# Patient Record
Sex: Male | Born: 1940
Health system: Southern US, Community
[De-identification: ages and names within clinical notes are randomized; demographics above are authoritative.]

## PROBLEM LIST (undated history)

## (undated) DIAGNOSIS — E785 Hyperlipidemia, unspecified: Secondary | ICD-10-CM

## (undated) DIAGNOSIS — I10 Essential (primary) hypertension: Secondary | ICD-10-CM

## (undated) DIAGNOSIS — N183 Chronic kidney disease, stage 3 unspecified: Secondary | ICD-10-CM

## (undated) DIAGNOSIS — C801 Malignant (primary) neoplasm, unspecified: Secondary | ICD-10-CM

## (undated) DIAGNOSIS — K219 Gastro-esophageal reflux disease without esophagitis: Secondary | ICD-10-CM

## (undated) HISTORY — DX: Chronic kidney disease, stage 3 unspecified: N18.30

## (undated) HISTORY — DX: Hyperlipidemia, unspecified: E78.5

## (undated) HISTORY — DX: Malignant (primary) neoplasm, unspecified: C80.1

## (undated) HISTORY — DX: Essential (primary) hypertension: I10

## (undated) HISTORY — DX: Gastro-esophageal reflux disease without esophagitis: K21.9

## (undated) HISTORY — PX: COLONOSCOPY WITH PROPOFOL: SHX5780

---

## 2012-12-26 HISTORY — PX: MELANOMA EXCISION: SHX5266

## 2014-12-22 ENCOUNTER — Ambulatory Visit: Payer: Self-pay | Admitting: Gastroenterology

## 2015-07-18 ENCOUNTER — Telehealth: Payer: Self-pay | Admitting: Family Medicine

## 2015-07-18 NOTE — Telephone Encounter (Signed)
PT NEEDS REFILLS ON  ALL MEDICATION,  BUT IS NEEDING THE AMLODIPINE BESYLATE 5MG  ESPECIALLY. PHARM IS [facility] [place].

## 2015-07-20 MED ORDER — AMLODIPINE BESYLATE 5 MG PO TABS: 5.0000 mg | ORAL_TABLET | Freq: Every day | ORAL | Status: DC

## 2015-07-20 NOTE — Telephone Encounter (Signed)
Amlodipine has been refilled and sent to [facility]

## 2015-07-25 ENCOUNTER — Encounter (INDEPENDENT_AMBULATORY_CARE_PROVIDER_SITE_OTHER): Payer: Self-pay

## 2015-07-25 ENCOUNTER — Ambulatory Visit (INDEPENDENT_AMBULATORY_CARE_PROVIDER_SITE_OTHER): Payer: Medicare Other | Admitting: Family Medicine

## 2015-07-25 ENCOUNTER — Encounter: Payer: Self-pay | Admitting: Family Medicine

## 2015-07-25 VITALS — BP 140/80 | HR 78 | Temp 98.2°F | Resp 18 | Ht 73.0 in | Wt 205.1 lb

## 2015-07-25 DIAGNOSIS — E785 Hyperlipidemia, unspecified: Secondary | ICD-10-CM | POA: Diagnosis not present

## 2015-07-25 DIAGNOSIS — I10 Essential (primary) hypertension: Secondary | ICD-10-CM

## 2015-07-25 DIAGNOSIS — G250 Essential tremor: Secondary | ICD-10-CM | POA: Insufficient documentation

## 2015-07-25 DIAGNOSIS — R3911 Hesitancy of micturition: Secondary | ICD-10-CM | POA: Insufficient documentation

## 2015-07-25 DIAGNOSIS — K219 Gastro-esophageal reflux disease without esophagitis: Secondary | ICD-10-CM | POA: Diagnosis not present

## 2015-07-25 DIAGNOSIS — J3089 Other allergic rhinitis: Secondary | ICD-10-CM | POA: Insufficient documentation

## 2015-07-25 MED ORDER — PRAVASTATIN SODIUM 40 MG PO TABS: 40.0000 mg | ORAL_TABLET | Freq: Every day | ORAL | Status: DC

## 2015-07-25 MED ORDER — LOSARTAN POTASSIUM-HCTZ 50-12.5 MG PO TABS: 1.0000 | ORAL_TABLET | Freq: Every day | ORAL | Status: DC

## 2015-07-25 MED ORDER — AMLODIPINE BESYLATE 5 MG PO TABS: 5.0000 mg | ORAL_TABLET | Freq: Every day | ORAL | Status: DC

## 2015-07-25 NOTE — Progress Notes (Signed)
Name: Zachary Meyer   MRN: [id number]    DOB: 1941/08/17   Date:07/25/2015       Progress Note  Subjective  Chief Complaint  Chief Complaint  Patient presents with  . Follow-up    3 mo. elevated BP  . Hypertension  . Hyperlipidemia  . Gastrophageal Reflux    Hypertension This is a chronic problem. The problem is controlled. Pertinent negatives include no chest pain, headaches, palpitations or shortness of breath. Past treatments include angiotensin blockers, diuretics and calcium channel blockers.  Hyperlipidemia This is a new problem. The problem is controlled. Pertinent negatives include no chest pain, leg pain, myalgias or shortness of breath. Current antihyperlipidemic treatment includes statins.  Gastrophageal Reflux He reports no abdominal pain, no belching, no chest pain, no dysphagia, no heartburn or no sore throat. This is a chronic problem. He has tried a PPI for the symptoms.     Past Medical History  Diagnosis Date  . GERD (gastroesophageal reflux disease)   . Hypertension   . Hyperlipidemia     Past Surgical History  Procedure Laterality Date  . Melanoma excision Left 12/2012    Family History  Problem Relation Age of Onset  . Hypertension Sister   . Hypertension Brother     Social History   Social History  . Marital Status: Married    Spouse Name: N/A  . Number of Children: N/A  . Years of Education: N/A   Occupational History  . Not on file.   Social History Main Topics  . Smoking status: Former Research scientist (life sciences)  . Smokeless tobacco: Never Used  . Alcohol Use: No  . Drug Use: No  . Sexual Activity: No   Other Topics Concern  . Not on file   Social History Narrative  . No narrative on file     Current outpatient prescriptions:  .  amLODipine (NORVASC) 5 MG tablet, Take 1 tablet (5 mg total) by mouth daily., Disp: 90 tablet, Rfl: 0 .  fluticasone (FLONASE) 50 MCG/ACT nasal spray, INSTILL 2 SPRAYS INTO EACH NOSTRIL DAILY, Disp: , Rfl: 0 .   losartan-hydrochlorothiazide (HYZAAR) 50-12.5 MG per tablet, Take 1 tablet by mouth daily., Disp: , Rfl: 0 .  omeprazole (PRILOSEC OTC) 20 MG tablet, Take 20 mg by mouth as needed., Disp: , Rfl:  .  pravastatin (PRAVACHOL) 40 MG tablet, Take 40 mg by mouth at bedtime., Disp: , Rfl: 0 .  propranolol (INDERAL) 20 MG tablet, Take 20 mg by mouth 2 (two) times daily., Disp: , Rfl: 1  No Known Allergies   Review of Systems  HENT: Negative for sore throat.   Respiratory: Negative for shortness of breath.   Cardiovascular: Negative for chest pain and palpitations.  Gastrointestinal: Negative for heartburn, dysphagia and abdominal pain.  Musculoskeletal: Negative for myalgias.  Neurological: Negative for headaches.    Objective  Filed Vitals:   07/25/15 0932  BP: 140/80  Pulse: 78  Temp: 98.2 F (36.8 C)  TempSrc: Oral  Resp: 18  Height: 6\' 1"  (1.854 m)  Weight: 205 lb 1.6 oz (93.033 kg)  SpO2: 95%    Physical Exam  Constitutional: He is oriented to person, place, and time and well-developed, well-nourished, and in no distress.  HENT:  Head: Normocephalic and atraumatic.  Cardiovascular: Normal rate and regular rhythm.   Pulmonary/Chest: Effort normal and breath sounds normal.  Abdominal: Soft. Bowel sounds are normal.  Neurological: He is alert and oriented to person, place, and time.  Nursing note and  vitals reviewed.   Assessment & Plan  1. Essential hypertension  - amLODipine (NORVASC) 5 MG tablet; Take 1 tablet (5 mg total) by mouth daily.  Dispense: 90 tablet; Refill: 0 - losartan-hydrochlorothiazide (HYZAAR) 50-12.5 MG per tablet; Take 1 tablet by mouth daily.  Dispense: 90 tablet; Refill: 0  2. Gastroesophageal reflux disease, esophagitis presence not specified Stable on Prilosec OTC.  3. Hyperlipidemia  - Lipid Profile - COMPLETE METABOLIC PANEL WITH GFR - pravastatin (PRAVACHOL) 40 MG tablet; Take 1 tablet (40 mg total) by mouth at bedtime.  Dispense: 90  tablet; Refill: 0  Lorana Maffeo Asad A[facility] 07/25/2015 9:50 AM

## 2015-07-26 LAB — COMPREHENSIVE METABOLIC PANEL
A/G RATIO: 1.8 (ref 1.1–2.5)
ALBUMIN: 4.5 g/dL (ref 3.5–4.8)
ALK PHOS: 87 IU/L (ref 39–117)
ALT: 18 IU/L (ref 0–44)
AST: 17 IU/L (ref 0–40)
BILIRUBIN TOTAL: 0.6 mg/dL (ref 0.0–1.2)
BUN / CREAT RATIO: 9 — AB (ref 10–22)
BUN: 13 mg/dL (ref 8–27)
CHLORIDE: 99 mmol/L (ref 97–108)
CO2: 27 mmol/L (ref 18–29)
Calcium: 9.9 mg/dL (ref 8.6–10.2)
Creatinine, Ser: 1.38 mg/dL — ABNORMAL HIGH (ref 0.76–1.27)
GFR calc non Af Amer: 51 mL/min/{1.73_m2} — ABNORMAL LOW (ref >59)
GFR, EST AFRICAN AMERICAN: 59 mL/min/{1.73_m2} — AB (ref >59)
GLUCOSE: 111 mg/dL — AB (ref 65–99)
Globulin, Total: 2.5 g/dL (ref 1.5–4.5)
POTASSIUM: 4.6 mmol/L (ref 3.5–5.2)
SODIUM: 141 mmol/L (ref 134–144)
TOTAL PROTEIN: 7 g/dL (ref 6.0–8.5)

## 2015-07-26 LAB — LIPID PANEL
CHOLESTEROL TOTAL: 190 mg/dL (ref 100–199)
Chol/HDL Ratio: 5.8 ratio units — ABNORMAL HIGH (ref 0.0–5.0)
HDL: 33 mg/dL — AB (ref >39)
LDL Calculated: 129 mg/dL — ABNORMAL HIGH (ref 0–99)
TRIGLYCERIDES: 142 mg/dL (ref 0–149)
VLDL CHOLESTEROL CAL: 28 mg/dL (ref 5–40)

## 2015-07-28 ENCOUNTER — Telehealth: Payer: Self-pay | Admitting: Family Medicine

## 2015-07-28 MED ORDER — ATORVASTATIN CALCIUM 20 MG PO TABS: 20.0000 mg | ORAL_TABLET | Freq: Every day | ORAL | Status: DC

## 2015-07-28 NOTE — Telephone Encounter (Signed)
Medication has been filled and [facility] to [facility] [place] [facility]

## 2015-08-26 ENCOUNTER — Other Ambulatory Visit: Payer: Self-pay | Admitting: Family Medicine

## 2015-10-16 ENCOUNTER — Other Ambulatory Visit: Payer: Self-pay | Admitting: Family Medicine

## 2015-10-25 ENCOUNTER — Ambulatory Visit (INDEPENDENT_AMBULATORY_CARE_PROVIDER_SITE_OTHER): Payer: Medicare Other | Admitting: Family Medicine

## 2015-10-25 ENCOUNTER — Encounter: Payer: Self-pay | Admitting: Family Medicine

## 2015-10-25 VITALS — BP 138/78 | HR 74 | Temp 98.1°F | Resp 17 | Ht 73.0 in | Wt 206.3 lb

## 2015-10-25 DIAGNOSIS — R748 Abnormal levels of other serum enzymes: Secondary | ICD-10-CM

## 2015-10-25 DIAGNOSIS — K219 Gastro-esophageal reflux disease without esophagitis: Secondary | ICD-10-CM | POA: Diagnosis not present

## 2015-10-25 DIAGNOSIS — E785 Hyperlipidemia, unspecified: Secondary | ICD-10-CM | POA: Diagnosis not present

## 2015-10-25 DIAGNOSIS — I10 Essential (primary) hypertension: Secondary | ICD-10-CM | POA: Diagnosis not present

## 2015-10-25 DIAGNOSIS — R7989 Other specified abnormal findings of blood chemistry: Secondary | ICD-10-CM

## 2015-10-25 MED ORDER — AMLODIPINE BESYLATE 5 MG PO TABS: 5.0000 mg | ORAL_TABLET | Freq: Every day | ORAL | Status: DC

## 2015-10-25 MED ORDER — ATORVASTATIN CALCIUM 20 MG PO TABS: 20.0000 mg | ORAL_TABLET | Freq: Every day | ORAL | Status: DC

## 2015-10-25 MED ORDER — LOSARTAN POTASSIUM-HCTZ 50-12.5 MG PO TABS: 1.0000 | ORAL_TABLET | Freq: Every day | ORAL | Status: DC

## 2015-10-25 NOTE — Progress Notes (Signed)
Name: Zachary Meyer   MRN: [id number]    DOB: Dec 19, 1940   Date:10/25/2015       Progress Note  Subjective  Chief Complaint  Chief Complaint  Patient presents with  . Follow-up    3 mo  . Hypertension  . Hyperlipidemia  . Gastroesophageal Reflux    Hypertension This is a chronic problem. The problem is controlled. Pertinent negatives include no blurred vision, chest pain, headaches or shortness of breath. Past treatments include calcium channel blockers, angiotensin blockers and diuretics. There is no history of CAD/MI or CVA.  Hyperlipidemia This is a chronic problem. The problem is controlled. Recent lipid tests were reviewed and are high. He has no history of obesity. Pertinent negatives include no chest pain, leg pain, myalgias or shortness of breath. Current antihyperlipidemic treatment includes statins. There are no compliance problems.  Risk factors for coronary artery disease include dyslipidemia, male sex and hypertension.  Gastroesophageal Reflux He reports no abdominal pain, no belching, no chest pain, no coughing, no heartburn, no nausea or no sore throat. This is a chronic problem. The problem has been unchanged. He has tried a PPI for the symptoms. The treatment provided significant relief.     Past Medical History  Diagnosis Date  . GERD (gastroesophageal reflux disease)   . Hypertension   . Hyperlipidemia     Past Surgical History  Procedure Laterality Date  . Melanoma excision Left 12/2012    Family History  Problem Relation Age of Onset  . Hypertension Sister   . Hypertension Brother     Social History   Social History  . Marital Status: Married    Spouse Name: N/A  . Number of Children: N/A  . Years of Education: N/A   Occupational History  . Not on file.   Social History Main Topics  . Smoking status: Former Research scientist (life sciences)  . Smokeless tobacco: Never Used  . Alcohol Use: No  . Drug Use: No  . Sexual Activity: No   Other Topics Concern  . Not  on file   Social History Narrative     Current outpatient prescriptions:  .  amLODipine (NORVASC) 5 MG tablet, TAKE 1 TABLET (5 MG TOTAL) BY MOUTH DAILY., Disp: 90 tablet, Rfl: 0 .  atorvastatin (LIPITOR) 20 MG tablet, TAKE 1 TABLET (20 MG TOTAL) BY MOUTH AT BEDTIME., Disp: 30 tablet, Rfl: 2 .  fluticasone (FLONASE) 50 MCG/ACT nasal spray, INSTILL 2 SPRAYS INTO EACH NOSTRIL DAILY, Disp: , Rfl: 0 .  losartan-hydrochlorothiazide (HYZAAR) 50-12.5 MG per tablet, Take 1 tablet by mouth daily., Disp: 90 tablet, Rfl: 0 .  omeprazole (PRILOSEC OTC) 20 MG tablet, Take 20 mg by mouth as needed., Disp: , Rfl:  .  propranolol (INDERAL) 20 MG tablet, Take 20 mg by mouth 2 (two) times daily., Disp: , Rfl: 1  No Known Allergies   Review of Systems  Constitutional: Negative for fever and chills.  HENT: Negative for sore throat.   Eyes: Negative for blurred vision.  Respiratory: Negative for cough and shortness of breath.   Cardiovascular: Negative for chest pain.  Gastrointestinal: Negative for heartburn, nausea, vomiting and abdominal pain.  Musculoskeletal: Negative for myalgias and joint pain.  Neurological: Negative for headaches.     Objective  Filed Vitals:   10/25/15 0805  BP: 138/78  Pulse: 74  Temp: 98.1 F (36.7 C)  TempSrc: Oral  Resp: 17  Height: 6\' 1"  (1.854 m)  Weight: 206 lb 4.8 oz (93.577 kg)  SpO2: 97%  Physical Exam  Constitutional: He is oriented to person, place, and time and well-developed, well-nourished, and in no distress.  HENT:  Head: Normocephalic and atraumatic.  Eyes: Pupils are equal, round, and reactive to light.  Cardiovascular: Normal rate, regular rhythm and normal heart sounds.   Pulmonary/Chest: Effort normal and breath sounds normal.  Abdominal: Soft. Bowel sounds are normal.  Musculoskeletal: He exhibits no edema.  Neurological: He is alert and oriented to person, place, and time.  Psychiatric: Mood, memory, affect and judgment normal.   Nursing note and vitals reviewed.    Assessment & Plan  1. Essential hypertension BP stable and controlled on present therapy. Refills provided - amLODipine (NORVASC) 5 MG tablet; Take 1 tablet (5 mg total) by mouth daily.  Dispense: 90 tablet; Refill: 0 - losartan-hydrochlorothiazide (HYZAAR) 50-12.5 MG tablet; Take 1 tablet by mouth daily.  Dispense: 90 tablet; Refill: 0  2. Gastroesophageal reflux disease, esophagitis presence not specified Takes Prilosec OTC for relief of occasional heartburn. Stable  3. Hyperlipidemia Changed from pravastatin to atorvastatin in August 2016. Recheck FLP today. - atorvastatin (LIPITOR) 20 MG tablet; Take 1 tablet (20 mg total) by mouth daily at 6 PM.  Dispense: 90 tablet; Refill: 0 - Lipid Profile  4. Elevated serum creatinine  - Comprehensive Metabolic Panel (CMET)   Dorlis Judice [facility] A[facility] 10/25/2015 8:21 AM

## 2015-10-26 LAB — COMPREHENSIVE METABOLIC PANEL
A/G RATIO: 1.9 (ref 1.1–2.5)
ALK PHOS: 94 IU/L (ref 39–117)
ALT: 19 IU/L (ref 0–44)
AST: 17 IU/L (ref 0–40)
Albumin: 4.6 g/dL (ref 3.5–4.8)
BILIRUBIN TOTAL: 0.6 mg/dL (ref 0.0–1.2)
BUN/Creatinine Ratio: 9 — ABNORMAL LOW (ref 10–22)
BUN: 11 mg/dL (ref 8–27)
CALCIUM: 9.7 mg/dL (ref 8.6–10.2)
CHLORIDE: 99 mmol/L (ref 97–106)
CO2: 27 mmol/L (ref 18–29)
Creatinine, Ser: 1.25 mg/dL (ref 0.76–1.27)
GFR calc Af Amer: 67 mL/min/{1.73_m2} (ref >59)
GFR, EST NON AFRICAN AMERICAN: 58 mL/min/{1.73_m2} — AB (ref >59)
GLOBULIN, TOTAL: 2.4 g/dL (ref 1.5–4.5)
Glucose: 107 mg/dL — ABNORMAL HIGH (ref 65–99)
POTASSIUM: 4.5 mmol/L (ref 3.5–5.2)
SODIUM: 139 mmol/L (ref 136–144)
Total Protein: 7 g/dL (ref 6.0–8.5)

## 2015-10-26 LAB — LIPID PANEL
CHOL/HDL RATIO: 4.8 ratio (ref 0.0–5.0)
CHOLESTEROL TOTAL: 154 mg/dL (ref 100–199)
HDL: 32 mg/dL — AB (ref >39)
LDL Calculated: 102 mg/dL — ABNORMAL HIGH (ref 0–99)
TRIGLYCERIDES: 99 mg/dL (ref 0–149)
VLDL Cholesterol Cal: 20 mg/dL (ref 5–40)

## 2015-11-12 ENCOUNTER — Other Ambulatory Visit: Payer: Self-pay | Admitting: Family Medicine

## 2015-11-15 NOTE — Telephone Encounter (Signed)
Medication has been refilled and [facility] to [facility] [place] 

## 2015-12-26 ENCOUNTER — Ambulatory Visit (INDEPENDENT_AMBULATORY_CARE_PROVIDER_SITE_OTHER): Payer: Medicare Other | Admitting: Family Medicine

## 2015-12-26 ENCOUNTER — Encounter: Payer: Self-pay | Admitting: Family Medicine

## 2015-12-26 VITALS — BP 122/84 | HR 77 | Temp 98.5°F | Resp 14 | Ht 73.0 in | Wt 208.7 lb

## 2015-12-26 DIAGNOSIS — I10 Essential (primary) hypertension: Secondary | ICD-10-CM | POA: Diagnosis not present

## 2015-12-26 DIAGNOSIS — E785 Hyperlipidemia, unspecified: Secondary | ICD-10-CM

## 2015-12-26 DIAGNOSIS — G250 Essential tremor: Secondary | ICD-10-CM

## 2015-12-26 MED ORDER — LOSARTAN POTASSIUM-HCTZ 50-12.5 MG PO TABS: 1.0000 | ORAL_TABLET | Freq: Every day | ORAL | Status: DC

## 2015-12-26 MED ORDER — PROPRANOLOL HCL 20 MG PO TABS: 20.0000 mg | ORAL_TABLET | Freq: Two times a day (BID) | ORAL | Status: DC

## 2015-12-26 MED ORDER — AMLODIPINE BESYLATE 5 MG PO TABS: 5.0000 mg | ORAL_TABLET | Freq: Every day | ORAL | Status: DC

## 2015-12-26 NOTE — Progress Notes (Signed)
Name: Zachary Meyer   MRN: [id number]    DOB: Oct 16, 1941   Date:12/26/2015       Progress Note  Subjective  Chief Complaint  Chief Complaint  Patient presents with  . Medication Refill  . Hyperlipidemia    Pt states is to change chol. med due to ins. not covering anymore    Hypertension This is a chronic problem. The problem is controlled. Pertinent negatives include no blurred vision, chest pain, headaches, palpitations or shortness of breath. Past treatments include calcium channel blockers, diuretics and angiotensin blockers. There is no history of kidney disease, CAD/MI or CVA.  Hyperlipidemia This is a chronic problem. The problem is controlled. Recent lipid tests were reviewed and are normal. Pertinent negatives include no chest pain or shortness of breath. Current antihyperlipidemic treatment includes statins (Pt. received a letter from his [employer] stating they will cover a temporary supply (30 days) for Atorvastatin). The current treatment provides significant improvement of lipids. There are no compliance problems.    Tremor Pt. Has a tremor in his left hand, diagnosed as essential tremor by Neurology. He has been taking Propranolol 20 mg twice daily. Reports tremor is worse when when he picks up something (such as picking up a fork, reaching out for an object etc). Tremor has not progressed to the contralateral extremity or the trunk. Past Medical History  Diagnosis Date  . GERD (gastroesophageal reflux disease)   . Hypertension   . Hyperlipidemia     Past Surgical History  Procedure Laterality Date  . Melanoma excision Left 12/2012    Family History  Problem Relation Age of Onset  . Hypertension Sister   . Hypertension Brother     Social History   Social History  . Marital Status: Married    Spouse Name: N/A  . Number of Children: N/A  . Years of Education: N/A   Occupational History  . Not on file.   Social History Main Topics  . Smoking  status: Former Research scientist (life sciences)  . Smokeless tobacco: Never Used  . Alcohol Use: No  . Drug Use: No  . Sexual Activity: No   Other Topics Concern  . Not on file   Social History Narrative     Current outpatient prescriptions:  .  amLODipine (NORVASC) 5 MG tablet, Take 1 tablet (5 mg total) by mouth daily., Disp: 90 tablet, Rfl: 0 .  atorvastatin (LIPITOR) 20 MG tablet, Take 1 tablet (20 mg total) by mouth daily at 6 PM., Disp: 90 tablet, Rfl: 0 .  fluticasone (FLONASE) 50 MCG/ACT nasal spray, INSTILL 2 SPRAYS INTO EACH NOSTRIL DAILY, Disp: , Rfl: 0 .  losartan-hydrochlorothiazide (HYZAAR) 50-12.5 MG tablet, Take 1 tablet by mouth daily., Disp: 90 tablet, Rfl: 0 .  losartan-hydrochlorothiazide (HYZAAR) 50-12.5 MG tablet, Take 1 tablet by mouth daily., Disp: 90 tablet, Rfl: 0 .  omeprazole (PRILOSEC OTC) 20 MG tablet, Take 20 mg by mouth as needed., Disp: , Rfl:  .  propranolol (INDERAL) 20 MG tablet, Take 20 mg by mouth 2 (two) times daily., Disp: , Rfl: 1  No Known Allergies   Review of Systems  Unable to perform ROS Eyes: Negative for blurred vision.  Respiratory: Negative for shortness of breath.   Cardiovascular: Negative for chest pain and palpitations.  Neurological: Negative for headaches.    Objective  Filed Vitals:   12/26/15 1032  BP: 122/84  Pulse: 77  Temp: 98.5 F (36.9 C)  TempSrc: Oral  Resp: 14  Height: 6\' 1"  (  1.854 m)  Weight: 208 lb 11.2 oz (94.666 kg)  SpO2: 95%    Physical Exam  Constitutional: He is oriented to person, place, and time and well-developed, well-nourished, and in no distress.  HENT:  Head: Normocephalic and atraumatic.  Cardiovascular: Normal rate, regular rhythm and normal heart sounds.   Pulmonary/Chest: Effort normal and breath sounds normal.  Musculoskeletal: He exhibits no edema.  Neurological: He is alert and oriented to person, place, and time.  Rhythmic essential tremor in left hand  Nursing note and vitals  reviewed.      Assessment & Plan  1. Benign essential tremor Continue on beta blocker therapy. Stable - propranolol (INDERAL) 20 MG tablet; Take 1 tablet (20 mg total) by mouth 2 (two) times daily.  Dispense: 60 tablet; Refill: 2  2. Essential hypertension BP controlled - amLODipine (NORVASC) 5 MG tablet; Take 1 tablet (5 mg total) by mouth daily.  Dispense: 90 tablet; Refill: 0 - losartan-hydrochlorothiazide (HYZAAR) 50-12.5 MG tablet; Take 1 tablet by mouth daily.  Dispense: 90 tablet; Refill: 0  3. Hyperlipidemia Advised to request a list of covered statin drugs from his [employer] and bring with him to next appointment for review.    Zachary Meyer Asad A[facility] 12/26/2015 11:00 AM

## 2016-01-05 ENCOUNTER — Other Ambulatory Visit: Payer: Self-pay | Admitting: Family Medicine

## 2016-01-20 ENCOUNTER — Other Ambulatory Visit: Payer: Self-pay | Admitting: Family Medicine

## 2016-01-23 ENCOUNTER — Ambulatory Visit (INDEPENDENT_AMBULATORY_CARE_PROVIDER_SITE_OTHER): Payer: Medicare Other | Admitting: Family Medicine

## 2016-01-23 ENCOUNTER — Encounter: Payer: Self-pay | Admitting: Family Medicine

## 2016-01-23 VITALS — BP 124/78 | HR 69 | Temp 97.6°F | Resp 20 | Ht 73.0 in | Wt 208.1 lb

## 2016-01-23 DIAGNOSIS — R739 Hyperglycemia, unspecified: Secondary | ICD-10-CM | POA: Diagnosis not present

## 2016-01-23 DIAGNOSIS — E785 Hyperlipidemia, unspecified: Secondary | ICD-10-CM

## 2016-01-23 LAB — GLUCOSE, POCT (MANUAL RESULT ENTRY): POC Glucose: 93 mg/dl (ref 70–99)

## 2016-01-23 LAB — POCT GLYCOSYLATED HEMOGLOBIN (HGB A1C): Hemoglobin A1C: 5.8

## 2016-01-23 MED ORDER — ATORVASTATIN CALCIUM 20 MG PO TABS: 20.0000 mg | ORAL_TABLET | Freq: Every day | ORAL | Status: DC

## 2016-01-23 NOTE — Progress Notes (Signed)
Name: Zachary Meyer   MRN: [id number]    DOB: 1940-12-25   Date:01/23/2016       Progress Note  Subjective  Chief Complaint  Chief Complaint  Patient presents with  . Follow-up    3 mo  . Hypertension  . Hyperlipidemia  . Gastroesophageal Reflux    Hyperlipidemia This is a chronic problem. The problem is controlled. Pertinent negatives include no leg pain, myalgias or shortness of breath. Current antihyperlipidemic treatment includes statins.     Past Medical History  Diagnosis Date  . GERD (gastroesophageal reflux disease)   . Hypertension   . Hyperlipidemia     Past Surgical History  Procedure Laterality Date  . Melanoma excision Left 12/2012    Family History  Problem Relation Age of Onset  . Hypertension Sister   . Hypertension Brother     Social History   Social History  . Marital Status: Married    Spouse Name: N/A  . Number of Children: N/A  . Years of Education: N/A   Occupational History  . Not on file.   Social History Main Topics  . Smoking status: Former Research scientist (life sciences)  . Smokeless tobacco: Never Used  . Alcohol Use: No  . Drug Use: No  . Sexual Activity: No   Other Topics Concern  . Not on file   Social History Narrative     Current outpatient prescriptions:  .  amLODipine (NORVASC) 5 MG tablet, Take 1 tablet (5 mg total) by mouth daily., Disp: 90 tablet, Rfl: 0 .  atorvastatin (LIPITOR) 20 MG tablet, Take 1 tablet (20 mg total) by mouth daily at 6 PM., Disp: 90 tablet, Rfl: 0 .  fluticasone (FLONASE) 50 MCG/ACT nasal spray, INSTILL 2 SPRAYS INTO EACH NOSTRIL DAILY, Disp: , Rfl: 0 .  losartan-hydrochlorothiazide (HYZAAR) 50-12.5 MG tablet, TAKE 1 TABLET BY MOUTH DAILY., Disp: 90 tablet, Rfl: 0 .  omeprazole (PRILOSEC OTC) 20 MG tablet, Take 20 mg by mouth as needed., Disp: , Rfl:  .  propranolol (INDERAL) 20 MG tablet, Take 1 tablet (20 mg total) by mouth 2 (two) times daily., Disp: 60 tablet, Rfl: 2  No Known Allergies   Review of  Systems  Respiratory: Negative for shortness of breath.   Musculoskeletal: Negative for myalgias.    Objective  Filed Vitals:   01/23/16 0837  BP: 124/78  Pulse: 69  Temp: 97.6 F (36.4 C)  Resp: 20  Height: 6\' 1"  (1.854 m)  Weight: 208 lb 2 oz (94.405 kg)  SpO2: 96%    Physical Exam  Constitutional: He is well-developed, well-nourished, and in no distress.  HENT:  Head: Normocephalic and atraumatic.  Cardiovascular: Normal rate and regular rhythm.   Pulmonary/Chest: Effort normal and breath sounds normal.  Abdominal: Soft. Bowel sounds are normal.  Nursing note and vitals reviewed.        Assessment & Plan  1. Hyperlipidemia Patient has found out from his pharmacy that his insurance will cover atorvastatin 20 mg at bedtime. Continue on atorvastatin. - Lipid Profile - atorvastatin (LIPITOR) 20 MG tablet; Take 1 tablet (20 mg total) by mouth daily at 6 PM.  Dispense: 90 tablet; Refill: 0  2. Hyperglycemia  - POCT HgB A1C - POCT Glucose (CBG)   Apollo Timothy Asad A. [facility] 01/23/2016 8:41 AM

## 2016-01-24 LAB — LIPID PANEL
CHOL/HDL RATIO: 4.7 ratio (ref 0.0–5.0)
Cholesterol, Total: 161 mg/dL (ref 100–199)
HDL: 34 mg/dL — ABNORMAL LOW (ref >39)
LDL CALC: 104 mg/dL — AB (ref 0–99)
Triglycerides: 116 mg/dL (ref 0–149)
VLDL Cholesterol Cal: 23 mg/dL (ref 5–40)

## 2016-03-12 ENCOUNTER — Other Ambulatory Visit: Payer: Self-pay | Admitting: Family Medicine

## 2016-03-19 ENCOUNTER — Telehealth: Payer: Self-pay

## 2016-03-19 MED ORDER — FLUTICASONE PROPIONATE 50 MCG/ACT NA SUSP: 2.0000 | Freq: Every day | NASAL | Status: DC

## 2016-03-19 NOTE — Telephone Encounter (Signed)
Medication has been refilled and [facility] to [facility] [place] 

## 2016-04-09 ENCOUNTER — Other Ambulatory Visit: Payer: Self-pay | Admitting: Family Medicine

## 2016-04-23 ENCOUNTER — Ambulatory Visit (INDEPENDENT_AMBULATORY_CARE_PROVIDER_SITE_OTHER): Payer: Medicare Other | Admitting: Family Medicine

## 2016-04-23 ENCOUNTER — Encounter: Payer: Self-pay | Admitting: Family Medicine

## 2016-04-23 VITALS — BP 128/76 | HR 65 | Temp 97.9°F | Resp 16 | Ht 73.0 in | Wt 208.3 lb

## 2016-04-23 DIAGNOSIS — E785 Hyperlipidemia, unspecified: Secondary | ICD-10-CM

## 2016-04-23 DIAGNOSIS — G250 Essential tremor: Secondary | ICD-10-CM

## 2016-04-23 DIAGNOSIS — I10 Essential (primary) hypertension: Secondary | ICD-10-CM | POA: Diagnosis not present

## 2016-04-23 MED ORDER — ATORVASTATIN CALCIUM 20 MG PO TABS: 20.0000 mg | ORAL_TABLET | Freq: Every day | ORAL | Status: DC

## 2016-04-23 MED ORDER — AMLODIPINE BESYLATE 5 MG PO TABS: 5.0000 mg | ORAL_TABLET | Freq: Every day | ORAL | Status: DC

## 2016-04-23 MED ORDER — PROPRANOLOL HCL 20 MG PO TABS: ORAL_TABLET | ORAL | Status: DC

## 2016-04-23 MED ORDER — LOSARTAN POTASSIUM-HCTZ 50-12.5 MG PO TABS: 1.0000 | ORAL_TABLET | Freq: Every day | ORAL | Status: DC

## 2016-04-23 NOTE — Progress Notes (Signed)
Name: Zachary Meyer   MRN: [id number]    DOB: 1941-09-21   Date:04/23/2016       Progress Note  Subjective  Chief Complaint  Chief Complaint  Patient presents with  . Hypertension    follow up  . Hyperlipidemia    Hypertension This is a chronic problem. The problem is controlled. Pertinent negatives include no blurred vision, chest pain, headaches, palpitations or shortness of breath. Past treatments include angiotensin blockers, diuretics and calcium channel blockers. There is no history of kidney disease, CAD/MI or CVA.  Hyperlipidemia This is a chronic problem. The problem is uncontrolled. Recent lipid tests were reviewed and are high (Elevated LDL). Pertinent negatives include no chest pain, leg pain, myalgias or shortness of breath. Current antihyperlipidemic treatment includes statins.   Tremor: Pt. Has intention tremor of the left hand, usually with reaching out to grab something, eating, or other activities. He has been on Propranolol 20 mg twice daily, which seems to control his tremor.   Past Medical History  Diagnosis Date  . GERD (gastroesophageal reflux disease)   . Hypertension   . Hyperlipidemia     Past Surgical History  Procedure Laterality Date  . Melanoma excision Left 12/2012    Family History  Problem Relation Age of Onset  . Hypertension Sister   . Hypertension Brother     Social History   Social History  . Marital Status: Married    Spouse Name: N/A  . Number of Children: N/A  . Years of Education: N/A   Occupational History  . Not on file.   Social History Main Topics  . Smoking status: Former Research scientist (life sciences)  . Smokeless tobacco: Never Used  . Alcohol Use: No  . Drug Use: No  . Sexual Activity: No   Other Topics Concern  . Not on file   Social History Narrative     Current outpatient prescriptions:  .  amLODipine (NORVASC) 5 MG tablet, Take 1 tablet (5 mg total) by mouth daily., Disp: 90 tablet, Rfl: 0 .  atorvastatin (LIPITOR) 20 MG  tablet, Take 1 tablet (20 mg total) by mouth daily at 6 PM., Disp: 90 tablet, Rfl: 0 .  fluticasone (FLONASE) 50 MCG/ACT nasal spray, Place 2 sprays into both nostrils daily., Disp: 16 g, Rfl: 0 .  losartan-hydrochlorothiazide (HYZAAR) 50-12.5 MG tablet, TAKE 1 TABLET BY MOUTH DAILY., Disp: 90 tablet, Rfl: 0 .  omeprazole (PRILOSEC OTC) 20 MG tablet, Take 20 mg by mouth as needed., Disp: , Rfl:  .  propranolol (INDERAL) 20 MG tablet, TAKE 1 TABLET (20 MG TOTAL) BY MOUTH 2 (TWO) TIMES DAILY., Disp: 60 tablet, Rfl: 0  No Known Allergies   Review of Systems  Eyes: Negative for blurred vision and double vision.  Respiratory: Negative for shortness of breath.   Cardiovascular: Negative for chest pain and palpitations.  Gastrointestinal: Negative for abdominal pain.  Musculoskeletal: Negative for myalgias.  Neurological: Positive for tremors. Negative for headaches.    Objective  Filed Vitals:   04/23/16 0919  BP: 128/76  Pulse: 65  Temp: 97.9 F (36.6 C)  TempSrc: Oral  Resp: 16  Height: 6\' 1"  (1.854 m)  Weight: 208 lb 4.8 oz (94.484 kg)  SpO2: 95%    Physical Exam  Constitutional: He is oriented to person, place, and time and well-developed, well-nourished, and in no distress.  HENT:  Head: Normocephalic and atraumatic.  Cardiovascular: Normal rate and regular rhythm.   Pulmonary/Chest: Effort normal and breath sounds normal.  Abdominal:  Soft. Bowel sounds are normal.  Musculoskeletal: He exhibits edema (1+ pitting edema on bilateral lower extremities.).  Neurological: He is alert and oriented to person, place, and time. He displays tremor (left hand tremor approx 4Hz ).  Psychiatric: Mood, memory, affect and judgment normal.  Nursing note and vitals reviewed.     Assessment & Plan  1. Essential hypertension  - amLODipine (NORVASC) 5 MG tablet; Take 1 tablet (5 mg total) by mouth daily.  Dispense: 90 tablet; Refill: 0 - losartan-hydrochlorothiazide (HYZAAR) 50-12.5 MG  tablet; Take 1 tablet by mouth daily.  Dispense: 90 tablet; Refill: 0  2. Hyperlipidemia  - atorvastatin (LIPITOR) 20 MG tablet; Take 1 tablet (20 mg total) by mouth daily at 6 PM.  Dispense: 90 tablet; Refill: 0 - Lipid Profile - Comprehensive Metabolic Panel (CMET)  3. Benign essential tremor  - propranolol (INDERAL) 20 MG tablet; TAKE 1 TABLET (20 MG TOTAL) BY MOUTH 2 (TWO) TIMES DAILY.  Dispense: 60 tablet; Refill: 5   Laurice Iglesia Asad A. [facility] 04/23/2016 9:29 AM

## 2016-04-24 LAB — COMPREHENSIVE METABOLIC PANEL
ALBUMIN: 4.5 g/dL (ref 3.5–4.8)
ALK PHOS: 96 IU/L (ref 39–117)
ALT: 18 IU/L (ref 0–44)
AST: 15 IU/L (ref 0–40)
Albumin/Globulin Ratio: 1.7 (ref 1.2–2.2)
BILIRUBIN TOTAL: 0.5 mg/dL (ref 0.0–1.2)
BUN / CREAT RATIO: 14 (ref 10–24)
BUN: 17 mg/dL (ref 8–27)
CHLORIDE: 99 mmol/L (ref 96–106)
CO2: 25 mmol/L (ref 18–29)
Calcium: 9.7 mg/dL (ref 8.6–10.2)
Creatinine, Ser: 1.21 mg/dL (ref 0.76–1.27)
GFR calc Af Amer: 69 mL/min/{1.73_m2} (ref >59)
GFR calc non Af Amer: 59 mL/min/{1.73_m2} — ABNORMAL LOW (ref >59)
GLUCOSE: 104 mg/dL — AB (ref 65–99)
Globulin, Total: 2.7 g/dL (ref 1.5–4.5)
Potassium: 4.4 mmol/L (ref 3.5–5.2)
SODIUM: 141 mmol/L (ref 134–144)
Total Protein: 7.2 g/dL (ref 6.0–8.5)

## 2016-04-24 LAB — LIPID PANEL
Chol/HDL Ratio: 4.5 ratio units (ref 0.0–5.0)
Cholesterol, Total: 154 mg/dL (ref 100–199)
HDL: 34 mg/dL — AB (ref >39)
LDL CALC: 99 mg/dL (ref 0–99)
Triglycerides: 106 mg/dL (ref 0–149)
VLDL CHOLESTEROL CAL: 21 mg/dL (ref 5–40)

## 2016-06-11 ENCOUNTER — Other Ambulatory Visit: Payer: Self-pay | Admitting: Emergency Medicine

## 2016-06-11 MED ORDER — FLUTICASONE PROPIONATE 50 MCG/ACT NA SUSP: 2.0000 | Freq: Every day | NASAL | Status: DC

## 2016-07-19 ENCOUNTER — Other Ambulatory Visit: Payer: Self-pay | Admitting: Family Medicine

## 2016-07-19 DIAGNOSIS — E785 Hyperlipidemia, unspecified: Secondary | ICD-10-CM

## 2016-07-19 DIAGNOSIS — I10 Essential (primary) hypertension: Secondary | ICD-10-CM

## 2016-07-24 ENCOUNTER — Encounter: Payer: Self-pay | Admitting: Family Medicine

## 2016-07-24 ENCOUNTER — Ambulatory Visit (INDEPENDENT_AMBULATORY_CARE_PROVIDER_SITE_OTHER): Payer: Medicare Other | Admitting: Family Medicine

## 2016-07-24 VITALS — BP 124/68 | HR 68 | Temp 97.7°F | Resp 15 | Ht 73.0 in | Wt 208.9 lb

## 2016-07-24 DIAGNOSIS — I10 Essential (primary) hypertension: Secondary | ICD-10-CM | POA: Diagnosis not present

## 2016-07-24 DIAGNOSIS — J309 Allergic rhinitis, unspecified: Secondary | ICD-10-CM

## 2016-07-24 DIAGNOSIS — E785 Hyperlipidemia, unspecified: Secondary | ICD-10-CM

## 2016-07-24 MED ORDER — LOSARTAN POTASSIUM-HCTZ 50-12.5 MG PO TABS: 1.0000 | ORAL_TABLET | Freq: Every day | ORAL | 0 refills | Status: DC

## 2016-07-24 MED ORDER — AMLODIPINE BESYLATE 5 MG PO TABS: 5.0000 mg | ORAL_TABLET | Freq: Every day | ORAL | 0 refills | Status: DC

## 2016-07-24 MED ORDER — ATORVASTATIN CALCIUM 20 MG PO TABS
20.0000 mg | ORAL_TABLET | Freq: Every day | ORAL | 0 refills | Status: DC
Start: 2016-07-24 — End: 2016-10-24

## 2016-07-24 MED ORDER — FLUTICASONE PROPIONATE 50 MCG/ACT NA SUSP: 2.0000 | Freq: Every day | NASAL | 1 refills | Status: DC

## 2016-07-24 NOTE — Progress Notes (Signed)
Name: Zachary Meyer   MRN: [id number]    DOB: 12/29/1940   Date:07/24/2016       Progress Note  Subjective  Chief Complaint  Chief Complaint  Patient presents with  . Follow-up    3 mo    Hypertension  This is a chronic problem. The problem is unchanged. The problem is controlled. Pertinent negatives include no blurred vision, chest pain, headaches, palpitations or shortness of breath. Past treatments include angiotensin blockers, diuretics and calcium channel blockers. There is no history of kidney disease, CAD/MI or CVA.  Hyperlipidemia  This is a chronic problem. Recent lipid tests were reviewed and are normal (Elevated LDL). Pertinent negatives include no chest pain, leg pain, myalgias or shortness of breath. Current antihyperlipidemic treatment includes statins.    Past Medical History:  Diagnosis Date  . GERD (gastroesophageal reflux disease)   . Hyperlipidemia   . Hypertension     Past Surgical History:  Procedure Laterality Date  . MELANOMA EXCISION Left 12/2012    Family History  Problem Relation Age of Onset  . Hypertension Sister   . Hypertension Brother     Social History   Social History  . Marital status: Married    Spouse name: N/A  . Number of children: N/A  . Years of education: N/A   Occupational History  . Not on file.   Social History Main Topics  . Smoking status: Former Research scientist (life sciences)  . Smokeless tobacco: Never Used  . Alcohol use No  . Drug use: No  . Sexual activity: No   Other Topics Concern  . Not on file   Social History Narrative  . No narrative on file     Current Outpatient Prescriptions:  .  amLODipine (NORVASC) 5 MG tablet, Take 1 tablet (5 mg total) by mouth daily., Disp: 90 tablet, Rfl: 0 .  atorvastatin (LIPITOR) 20 MG tablet, TAKE 1 TABLET (20 MG TOTAL) BY MOUTH DAILY AT 6 PM., Disp: 90 tablet, Rfl: 0 .  fluticasone (FLONASE) 50 MCG/ACT nasal spray, Place 2 sprays into both nostrils daily., Disp: 16 g, Rfl: 1 .   losartan-hydrochlorothiazide (HYZAAR) 50-12.5 MG tablet, TAKE 1 TABLET BY MOUTH DAILY., Disp: 90 tablet, Rfl: 0 .  omeprazole (PRILOSEC OTC) 20 MG tablet, Take 20 mg by mouth as needed., Disp: , Rfl:  .  propranolol (INDERAL) 20 MG tablet, TAKE 1 TABLET (20 MG TOTAL) BY MOUTH 2 (TWO) TIMES DAILY., Disp: 60 tablet, Rfl: 5  No Known Allergies   Review of Systems  HENT: Positive for congestion. Negative for sore throat.   Eyes: Negative for blurred vision.  Respiratory: Negative for cough and shortness of breath.   Cardiovascular: Negative for chest pain and palpitations.  Gastrointestinal: Negative for abdominal pain, nausea and vomiting.  Musculoskeletal: Negative for myalgias.  Neurological: Negative for headaches.    Objective  Vitals:   07/24/16 0927  BP: 124/68  Pulse: 68  Resp: 15  Temp: 97.7 F (36.5 C)  TempSrc: Oral  SpO2: (!) 68%  Weight: 208 lb 14.4 oz (94.8 kg)  Height: 6\' 1"  (1.854 m)    Physical Exam  Constitutional: He is oriented to person, place, and time and well-developed, well-nourished, and in no distress.  HENT:  Head: Normocephalic and atraumatic.  Nose: Nose normal. No mucosal edema or sinus tenderness.  Mouth/Throat: No posterior oropharyngeal erythema.  Cardiovascular: Normal rate and regular rhythm.   Pulmonary/Chest: Effort normal and breath sounds normal.  Abdominal: Soft. Bowel sounds are normal.  Musculoskeletal: He exhibits no edema.  Neurological: He is alert and oriented to person, place, and time. He displays tremor (left hand tremor approx 4Hz ).  Psychiatric: Mood, memory, affect and judgment normal.  Nursing note and vitals reviewed.     Assessment & Plan  1. Essential hypertension BP stable and controlled on present antihypertensive the - amLODipine (NORVASC) 5 MG tablet; Take 1 tablet (5 mg total) by mouth daily.  Dispense: 90 tablet; Refill: 0 - losartan-hydrochlorothiazide (HYZAAR) 50-12.5 MG tablet; Take 1 tablet by mouth  daily.  Dispense: 90 tablet; Refill: 0  2. Allergic rhinitis, unspecified allergic rhinitis type Patient takes Flonase to relieve nasal stuffiness at bedtime. - fluticasone (FLONASE) 50 MCG/ACT nasal spray; Place 2 sprays into both nostrils daily.  Dispense: 16 g; Refill: 1  3. Hyperlipidemia Continue on atorvastatin at present dosage, FLP from May 2017 shows normal total cholesterol, triglycerides, and LDL cholesterol - atorvastatin (LIPITOR) 20 MG tablet; Take 1 tablet (20 mg total) by mouth daily at 6 PM.  Dispense: 90 tablet; Refill: 0   Jamara Vary [facility] 07/24/2016 9:43 AM

## 2016-08-27 ENCOUNTER — Ambulatory Visit (INDEPENDENT_AMBULATORY_CARE_PROVIDER_SITE_OTHER): Payer: Medicare Other | Admitting: Family Medicine

## 2016-08-27 ENCOUNTER — Encounter: Payer: Self-pay | Admitting: Family Medicine

## 2016-08-27 VITALS — BP 138/72 | HR 67 | Temp 97.7°F | Resp 18 | Ht 73.0 in | Wt 209.2 lb

## 2016-08-27 DIAGNOSIS — Z23 Encounter for immunization: Secondary | ICD-10-CM

## 2016-08-27 DIAGNOSIS — Z0000 Encounter for general adult medical examination without abnormal findings: Secondary | ICD-10-CM

## 2016-08-27 LAB — PSA: PSA: 0.7 ng/mL (ref <=4.0)

## 2016-08-27 LAB — TSH: TSH: 2.26 mIU/L (ref 0.40–4.50)

## 2016-08-27 NOTE — Progress Notes (Signed)
Name: Zachary Meyer   MRN: [id number]    DOB: 1941-08-03   Date:08/27/2016       Progress Note  Subjective  Chief Complaint  Chief Complaint  Patient presents with  . Annual Exam          Subjective:   Zachary Meyer is a 75 y.o. male who presents for Medicare Annual/Subsequent preventive examination.  Review of Systems:         Objective:    Vitals: BP 138/72 (BP Location: Left Arm, Patient Position: Sitting, Cuff Size: Normal)   Pulse 67   Temp 97.7 F (36.5 C) (Oral)   Resp 18   Ht 6\' 1"  (1.854 m)   Wt 209 lb 3.2 oz (94.9 kg)   SpO2 96%   BMI 27.60 kg/m   Body mass index is 27.6 kg/m.  Tobacco History  Smoking Status  . Former Smoker  Smokeless Tobacco  . Never Used     Counseling given: Not Answered   Past Medical History:  Diagnosis Date  . GERD (gastroesophageal reflux disease)   . Hyperlipidemia   . Hypertension    Past Surgical History:  Procedure Laterality Date  . MELANOMA EXCISION Left 12/2012   Family History  Problem Relation Age of Onset  . Hypertension Sister   . Hypertension Brother    History  Sexual Activity  . Sexual activity: No    Outpatient Encounter Prescriptions as of 08/27/2016  Medication Sig  . amLODipine (NORVASC) 5 MG tablet Take 1 tablet (5 mg total) by mouth daily.  Marland Kitchen atorvastatin (LIPITOR) 20 MG tablet Take 1 tablet (20 mg total) by mouth daily at 6 PM.  . fluticasone (FLONASE) 50 MCG/ACT nasal spray Place 2 sprays into both nostrils daily.  Marland Kitchen losartan-hydrochlorothiazide (HYZAAR) 50-12.5 MG tablet Take 1 tablet by mouth daily.  Marland Kitchen omeprazole (PRILOSEC OTC) 20 MG tablet Take 20 mg by mouth as needed.  . propranolol (INDERAL) 20 MG tablet TAKE 1 TABLET (20 MG TOTAL) BY MOUTH 2 (TWO) TIMES DAILY.   No facility-administered encounter medications on file as of 08/27/2016.     Activities of Daily Living In your present state of health, do you have any difficulty performing the following activities: 07/24/2016  04/23/2016  Hearing? Y N  Vision? Y N  Difficulty concentrating or making decisions? N N  Walking or climbing stairs? N N  Dressing or bathing? N N  Doing errands, shopping? N N  Some recent data might be hidden    Patient Care Team: Roselee Nova, MD as PCP - General (Family Medicine)   Assessment:    Exercise Activities and Dietary recommendations  Occasionally plays golf, walking on treadmills.  Goals    None     Fall Risk Fall Risk  07/24/2016 04/23/2016 01/23/2016 12/26/2015 10/25/2015  Falls in the past year? No No No No No   Depression Screen PHQ 2/9 Scores 07/24/2016 04/23/2016 01/23/2016 12/26/2015  PHQ - 2 Score 0 0 0 0    Cognitive Testing No flowsheet data found.  Immunization History  Administered Date(s) Administered  . Influenza, High Dose Seasonal PF 08/27/2016  . Pneumococcal Polysaccharide-23 08/27/2016   Screening Tests Health Maintenance  Topic Date Due  . ZOSTAVAX  11/06/2003  . PNA vac Low Risk Adult (1 of 2 - PCV13) 11/05/2008  . INFLUENZA VACCINE  06/25/2016  . TETANUS/TDAP  01/22/2017 (Originally 11/05/1962)  . COLONOSCOPY  11/30/2023      Plan:    During the course  of the visit the patient was educated and counseled about the following appropriate screening and preventive services:   Vaccines to include Pneumoccal, Influenza, Hepatitis B, Td, Zostavax, HCV  Electrocardiogram    Colorectal cancer screening    Prostate Cancer Screening  Glaucoma screening  Nutrition counseling     Patient Instructions (the written plan) was given to the patient.    Keith Rake, MD  08/27/2016    Past Medical History:  Diagnosis Date  . GERD (gastroesophageal reflux disease)   . Hyperlipidemia   . Hypertension     Past Surgical History:  Procedure Laterality Date  . MELANOMA EXCISION Left 12/2012    Family History  Problem Relation Age of Onset  . Hypertension Sister   . Hypertension Brother     Social History   Social  History  . Marital status: Married    Spouse name: N/A  . Number of children: N/A  . Years of education: N/A   Occupational History  . Not on file.   Social History Main Topics  . Smoking status: Former Research scientist (life sciences)  . Smokeless tobacco: Never Used  . Alcohol use No  . Drug use: No  . Sexual activity: No   Other Topics Concern  . Not on file   Social History Narrative  . No narrative on file     Current Outpatient Prescriptions:  .  amLODipine (NORVASC) 5 MG tablet, Take 1 tablet (5 mg total) by mouth daily., Disp: 90 tablet, Rfl: 0 .  atorvastatin (LIPITOR) 20 MG tablet, Take 1 tablet (20 mg total) by mouth daily at 6 PM., Disp: 90 tablet, Rfl: 0 .  fluticasone (FLONASE) 50 MCG/ACT nasal spray, Place 2 sprays into both nostrils daily., Disp: 16 g, Rfl: 1 .  losartan-hydrochlorothiazide (HYZAAR) 50-12.5 MG tablet, Take 1 tablet by mouth daily., Disp: 90 tablet, Rfl: 0 .  omeprazole (PRILOSEC OTC) 20 MG tablet, Take 20 mg by mouth as needed., Disp: , Rfl:  .  propranolol (INDERAL) 20 MG tablet, TAKE 1 TABLET (20 MG TOTAL) BY MOUTH 2 (TWO) TIMES DAILY., Disp: 60 tablet, Rfl: 5  No Known Allergies   Review of Systems  Constitutional: Negative for chills, fever and malaise/fatigue.  Eyes: Negative for blurred vision and double vision.  Respiratory: Negative for cough and shortness of breath.   Cardiovascular: Negative for chest pain and leg swelling.  Gastrointestinal: Negative for abdominal pain, blood in stool, constipation and diarrhea.  Genitourinary: Negative for hematuria.  Musculoskeletal: Negative for back pain, myalgias and neck pain.  Skin: Negative for rash.  Neurological: Negative for dizziness and headaches.  Psychiatric/Behavioral: Negative for depression. The patient is not nervous/anxious.     Objective  Vitals:   08/27/16 0908  BP: 138/72  Pulse: 67  Resp: 18  Temp: 97.7 F (36.5 C)  TempSrc: Oral  SpO2: 96%  Weight: 209 lb 3.2 oz (94.9 kg)  Height:  6\' 1"  (1.854 m)    Physical Exam  Constitutional: He is oriented to person, place, and time and well-developed, well-nourished, and in no distress.  HENT:  Head: Normocephalic and atraumatic.  Cardiovascular: Normal rate, regular rhythm and normal heart sounds.   No murmur heard. Pulmonary/Chest: Effort normal and breath sounds normal.  Abdominal: Soft. Bowel sounds are normal. There is no tenderness.  Genitourinary: Rectum normal. Rectal exam shows no external hemorrhoid. Prostate is enlarged.  Musculoskeletal: Normal range of motion. He exhibits no edema.  Neurological: He is alert and oriented to person, place, and time.  Psychiatric: Mood, memory, affect and judgment normal.  Nursing note and vitals reviewed.    Assessment & Plan  1. Medicare annual wellness visit, subsequent Age appropriate screenings obtained. Patient follows with gastroenterologist for screening colonoscopy - PSA - TSH - Vitamin D (25 hydroxy)  2. Need for influenza vaccination  - Flu vaccine HIGH DOSE PF (Fluzone High dose)  3. Need for pneumococcal vaccination  - Pneumococcal polysaccharide vaccine 23-valent greater than or equal to 2yo subcutaneous/IM   [employer] [facility] 08/27/2016 5:44 PM

## 2016-08-28 ENCOUNTER — Other Ambulatory Visit: Payer: Self-pay | Admitting: Emergency Medicine

## 2016-08-28 LAB — VITAMIN D 25 HYDROXY (VIT D DEFICIENCY, FRACTURES): Vit D, 25-Hydroxy: 29 ng/mL — ABNORMAL LOW (ref 30–100)

## 2016-08-28 MED ORDER — FLUTICASONE PROPIONATE 50 MCG/ACT NA SUSP: 2.0000 | Freq: Every day | NASAL | 1 refills | Status: DC

## 2016-10-17 ENCOUNTER — Other Ambulatory Visit: Payer: Self-pay | Admitting: Family Medicine

## 2016-10-17 DIAGNOSIS — G250 Essential tremor: Secondary | ICD-10-CM

## 2016-10-21 ENCOUNTER — Other Ambulatory Visit: Payer: Self-pay | Admitting: Family Medicine

## 2016-10-24 ENCOUNTER — Encounter: Payer: Self-pay | Admitting: Family Medicine

## 2016-10-24 ENCOUNTER — Ambulatory Visit (INDEPENDENT_AMBULATORY_CARE_PROVIDER_SITE_OTHER): Payer: Medicare Other | Admitting: Family Medicine

## 2016-10-24 VITALS — BP 128/68 | HR 71 | Temp 97.8°F | Ht 73.0 in | Wt 210.1 lb

## 2016-10-24 DIAGNOSIS — K219 Gastro-esophageal reflux disease without esophagitis: Secondary | ICD-10-CM

## 2016-10-24 DIAGNOSIS — Z2911 Encounter for prophylactic immunotherapy for respiratory syncytial virus (RSV): Secondary | ICD-10-CM

## 2016-10-24 DIAGNOSIS — I10 Essential (primary) hypertension: Secondary | ICD-10-CM | POA: Diagnosis not present

## 2016-10-24 DIAGNOSIS — E785 Hyperlipidemia, unspecified: Secondary | ICD-10-CM | POA: Diagnosis not present

## 2016-10-24 DIAGNOSIS — Z23 Encounter for immunization: Secondary | ICD-10-CM

## 2016-10-24 LAB — COMPLETE METABOLIC PANEL WITH GFR
ALBUMIN: 4.3 g/dL (ref 3.6–5.1)
ALK PHOS: 75 U/L (ref 40–115)
ALT: 18 U/L (ref 9–46)
AST: 14 U/L (ref 10–35)
BILIRUBIN TOTAL: 0.5 mg/dL (ref 0.2–1.2)
BUN: 12 mg/dL (ref 7–25)
CALCIUM: 9.5 mg/dL (ref 8.6–10.3)
CO2: 31 mmol/L (ref 20–31)
Chloride: 102 mmol/L (ref 98–110)
Creat: 1.34 mg/dL — ABNORMAL HIGH (ref 0.70–1.18)
GFR, EST NON AFRICAN AMERICAN: 53 mL/min — AB (ref >=60)
GFR, Est African American: 61 mL/min (ref >=60)
GLUCOSE: 104 mg/dL — AB (ref 65–99)
POTASSIUM: 4.6 mmol/L (ref 3.5–5.3)
SODIUM: 139 mmol/L (ref 135–146)
TOTAL PROTEIN: 6.9 g/dL (ref 6.1–8.1)

## 2016-10-24 LAB — LIPID PANEL
CHOL/HDL RATIO: 4 ratio (ref <5.0)
CHOLESTEROL: 140 mg/dL (ref <200)
HDL: 35 mg/dL — ABNORMAL LOW (ref >40)
LDL Cholesterol: 83 mg/dL (ref <100)
TRIGLYCERIDES: 111 mg/dL (ref <150)
VLDL: 22 mg/dL (ref <30)

## 2016-10-24 MED ORDER — LOSARTAN POTASSIUM-HCTZ 50-12.5 MG PO TABS: 1.0000 | ORAL_TABLET | Freq: Every day | ORAL | 0 refills | Status: DC

## 2016-10-24 MED ORDER — OMEPRAZOLE MAGNESIUM 20 MG PO TBEC: 20.0000 mg | DELAYED_RELEASE_TABLET | Freq: Every day | ORAL | 0 refills | Status: DC

## 2016-10-24 MED ORDER — ATORVASTATIN CALCIUM 20 MG PO TABS: 20.0000 mg | ORAL_TABLET | Freq: Every day | ORAL | 0 refills | Status: DC

## 2016-10-24 MED ORDER — AMLODIPINE BESYLATE 5 MG PO TABS: 5.0000 mg | ORAL_TABLET | Freq: Every day | ORAL | 0 refills | Status: DC

## 2016-10-24 NOTE — Progress Notes (Signed)
Name: Zachary Meyer   MRN: [id number]    DOB: Oct 27, 1941   Date:10/24/2016       Progress Note  Subjective  Chief Complaint  Chief Complaint  Patient presents with  . Follow-up    Hypertension  This is a chronic problem. The problem is unchanged. The problem is controlled. Pertinent negatives include no blurred vision, chest pain, headaches, palpitations or shortness of breath. Past treatments include angiotensin blockers, diuretics and calcium channel blockers. There is no history of kidney disease, CAD/MI or CVA.  Hyperlipidemia  This is a chronic problem. The problem is uncontrolled. Recent lipid tests were reviewed and are normal. Pertinent negatives include no chest pain, leg pain, myalgias or shortness of breath. Current antihyperlipidemic treatment includes statins.  Gastroesophageal Reflux  He complains of heartburn. He reports no chest pain or no coughing. This is a chronic problem. The problem has been unchanged. The symptoms are aggravated by certain foods (greasy food bring on the symptoms.). The treatment provided significant relief. Past procedures do not include an EGD.    Past Medical History:  Diagnosis Date  . GERD (gastroesophageal reflux disease)   . Hyperlipidemia   . Hypertension     Past Surgical History:  Procedure Laterality Date  . MELANOMA EXCISION Left 12/2012    Family History  Problem Relation Age of Onset  . Hypertension Sister   . Hypertension Brother     Social History   Social History  . Marital status: Married    Spouse name: N/A  . Number of children: N/A  . Years of education: N/A   Occupational History  . Not on file.   Social History Main Topics  . Smoking status: Former Research scientist (life sciences)  . Smokeless tobacco: Never Used  . Alcohol use No  . Drug use: No  . Sexual activity: No   Other Topics Concern  . Not on file   Social History Narrative  . No narrative on file     Current Outpatient Prescriptions:  .  amLODipine  (NORVASC) 5 MG tablet, Take 1 tablet (5 mg total) by mouth daily., Disp: 90 tablet, Rfl: 0 .  atorvastatin (LIPITOR) 20 MG tablet, Take 1 tablet (20 mg total) by mouth daily at 6 PM., Disp: 90 tablet, Rfl: 0 .  fluticasone (FLONASE) 50 MCG/ACT nasal spray, PLACE 2 SPRAYS INTO BOTH NOSTRILS DAILY., Disp: 16 g, Rfl: 1 .  losartan-hydrochlorothiazide (HYZAAR) 50-12.5 MG tablet, Take 1 tablet by mouth daily., Disp: 90 tablet, Rfl: 0 .  omeprazole (PRILOSEC OTC) 20 MG tablet, Take 20 mg by mouth as needed., Disp: , Rfl:  .  propranolol (INDERAL) 20 MG tablet, TAKE 1 TABLET (20 MG TOTAL) BY MOUTH 2 (TWO) TIMES DAILY., Disp: 60 tablet, Rfl: 5  No Known Allergies   Review of Systems  Eyes: Negative for blurred vision.  Respiratory: Negative for cough and shortness of breath.   Cardiovascular: Negative for chest pain and palpitations.  Gastrointestinal: Positive for heartburn.  Musculoskeletal: Negative for myalgias.  Neurological: Negative for headaches.    Objective  Vitals:   10/24/16 0834  BP: 128/68  Pulse: 71  Temp: 97.8 F (36.6 C)  SpO2: 96%  Weight: 210 lb 1.6 oz (95.3 kg)  Height: 6\' 1"  (1.854 m)    Physical Exam  Constitutional: He is oriented to person, place, and time and well-developed, well-nourished, and in no distress.  HENT:  Head: Normocephalic and atraumatic.  Eyes: Conjunctivae are normal. Pupils are equal, round, and reactive to light.  Cardiovascular: Normal rate, regular rhythm and normal heart sounds.   No murmur heard. Pulmonary/Chest: Effort normal and breath sounds normal. No respiratory distress. He has no wheezes. He has no rhonchi.  Abdominal: Soft. Bowel sounds are normal. There is no tenderness.  Neurological: He is alert and oriented to person, place, and time.  Psychiatric: Memory, affect and judgment normal.  Nursing note and vitals reviewed.    Assessment & Plan  1. Essential hypertension  - losartan-hydrochlorothiazide (HYZAAR) 50-12.5  MG tablet; Take 1 tablet by mouth daily.  Dispense: 90 tablet; Refill: 0 - amLODipine (NORVASC) 5 MG tablet; Take 1 tablet (5 mg total) by mouth daily.  Dispense: 90 tablet; Refill: 0  2. Hyperlipidemia, unspecified hyperlipidemia type  - Lipid Profile - COMPLETE METABOLIC PANEL WITH GFR - atorvastatin (LIPITOR) 20 MG tablet; Take 1 tablet (20 mg total) by mouth daily at 6 PM.  Dispense: 90 tablet; Refill: 0  3. Gastroesophageal reflux disease, esophagitis presence not specified  - omeprazole (PRILOSEC OTC) 20 MG tablet; Take 1 tablet (20 mg total) by mouth daily.  Dispense: 90 tablet; Refill: 0  4. Need for shingles vaccine  - Varicella-zoster vaccine subcutaneous   Minka Knight [facility]ad A[facility] 10/24/2016 8:47 AM

## 2016-12-30 ENCOUNTER — Other Ambulatory Visit: Payer: Self-pay | Admitting: Family Medicine

## 2017-01-18 ENCOUNTER — Other Ambulatory Visit: Payer: Self-pay | Admitting: Family Medicine

## 2017-01-18 DIAGNOSIS — K219 Gastro-esophageal reflux disease without esophagitis: Secondary | ICD-10-CM

## 2017-01-20 ENCOUNTER — Ambulatory Visit (INDEPENDENT_AMBULATORY_CARE_PROVIDER_SITE_OTHER): Payer: Medicare Other | Admitting: Family Medicine

## 2017-01-20 ENCOUNTER — Encounter: Payer: Self-pay | Admitting: Family Medicine

## 2017-01-20 DIAGNOSIS — K219 Gastro-esophageal reflux disease without esophagitis: Secondary | ICD-10-CM

## 2017-01-20 DIAGNOSIS — I10 Essential (primary) hypertension: Secondary | ICD-10-CM

## 2017-01-20 DIAGNOSIS — E785 Hyperlipidemia, unspecified: Secondary | ICD-10-CM

## 2017-01-20 MED ORDER — LOSARTAN POTASSIUM-HCTZ 50-12.5 MG PO TABS: 1.0000 | ORAL_TABLET | Freq: Every day | ORAL | 0 refills | Status: DC

## 2017-01-20 MED ORDER — OMEPRAZOLE MAGNESIUM 20 MG PO TBEC: 20.0000 mg | DELAYED_RELEASE_TABLET | Freq: Every day | ORAL | 0 refills | Status: DC

## 2017-01-20 MED ORDER — ATORVASTATIN CALCIUM 20 MG PO TABS: 20.0000 mg | ORAL_TABLET | Freq: Every day | ORAL | 0 refills | Status: DC

## 2017-01-20 MED ORDER — AMLODIPINE BESYLATE 5 MG PO TABS: 5.0000 mg | ORAL_TABLET | Freq: Every day | ORAL | 0 refills | Status: DC

## 2017-01-20 NOTE — Progress Notes (Signed)
Name: Zachary Meyer   MRN: [id number]    DOB: 1941-01-08   Date:01/20/2017       Progress Note  Subjective  Chief Complaint  Chief Complaint  Patient presents with  . Hypertension    3 month follow up  . Hyperlipidemia    Hypertension  This is a chronic problem. The problem is unchanged. The problem is controlled. Pertinent negatives include no blurred vision, chest pain, headaches, palpitations or shortness of breath. Past treatments include angiotensin blockers, diuretics and calcium channel blockers. There is no history of kidney disease, CAD/MI or CVA.  Hyperlipidemia  This is a chronic problem. The problem is controlled. Recent lipid tests were reviewed and are normal. Pertinent negatives include no chest pain, leg pain, myalgias or shortness of breath. Current antihyperlipidemic treatment includes statins.  Gastroesophageal Reflux  He reports no chest pain, no coughing, no heartburn or no nausea. This is a chronic problem. The problem has been unchanged. The symptoms are aggravated by certain foods (greasy food bring on the symptoms.). The treatment provided significant relief. Past procedures do not include an EGD.     Past Medical History:  Diagnosis Date  . GERD (gastroesophageal reflux disease)   . Hyperlipidemia   . Hypertension     Past Surgical History:  Procedure Laterality Date  . MELANOMA EXCISION Left 12/2012    Family History  Problem Relation Age of Onset  . Hypertension Sister   . Hypertension Brother     Social History   Social History  . Marital status: Married    Spouse name: N/A  . Number of children: N/A  . Years of education: N/A   Occupational History  . Not on file.   Social History Main Topics  . Smoking status: Former Research scientist (life sciences)  . Smokeless tobacco: Never Used  . Alcohol use No  . Drug use: No  . Sexual activity: No   Other Topics Concern  . Not on file   Social History Narrative  . No narrative on file     Current  Outpatient Prescriptions:  .  amLODipine (NORVASC) 5 MG tablet, Take 1 tablet (5 mg total) by mouth daily., Disp: 90 tablet, Rfl: 0 .  atorvastatin (LIPITOR) 20 MG tablet, Take 1 tablet (20 mg total) by mouth daily at 6 PM., Disp: 90 tablet, Rfl: 0 .  fluticasone (FLONASE) 50 MCG/ACT nasal spray, PLACE 2 SPRAYS INTO BOTH NOSTRILS DAILY., Disp: 16 g, Rfl: 2 .  losartan-hydrochlorothiazide (HYZAAR) 50-12.5 MG tablet, Take 1 tablet by mouth daily., Disp: 90 tablet, Rfl: 0 .  omeprazole (PRILOSEC OTC) 20 MG tablet, Take 1 tablet (20 mg total) by mouth daily., Disp: 90 tablet, Rfl: 0 .  propranolol (INDERAL) 20 MG tablet, TAKE 1 TABLET (20 MG TOTAL) BY MOUTH 2 (TWO) TIMES DAILY., Disp: 60 tablet, Rfl: 5  No Known Allergies   Review of Systems  Eyes: Negative for blurred vision.  Respiratory: Negative for cough and shortness of breath.   Cardiovascular: Negative for chest pain and palpitations.  Gastrointestinal: Negative for heartburn and nausea.  Musculoskeletal: Negative for myalgias.  Neurological: Negative for headaches.     Objective  Vitals:   01/20/17 0831  BP: 130/86  Pulse: 70  Resp: 16  Temp: 98.5 F (36.9 C)  TempSrc: Oral  SpO2: 95%  Weight: 211 lb 4.8 oz (95.8 kg)  Height: 6\' 1"  (1.854 m)    Physical Exam  Constitutional: He is oriented to person, place, and time and well-developed, well-nourished, and  in no distress.  HENT:  Head: Normocephalic and atraumatic.  Eyes: Conjunctivae are normal. Pupils are equal, round, and reactive to light.  Cardiovascular: Normal rate, regular rhythm and normal heart sounds.   No murmur heard. Pulmonary/Chest: Effort normal and breath sounds normal. No respiratory distress. He has no wheezes. He has no rhonchi.  Abdominal: Soft. Bowel sounds are normal. There is no tenderness.  Musculoskeletal: He exhibits no edema.  Neurological: He is alert and oriented to person, place, and time.  Psychiatric: Memory, affect and judgment  normal.  Nursing note and vitals reviewed.      Assessment & Plan  1. Essential hypertension BP stable on present antihypertensive therapy - amLODipine (NORVASC) 5 MG tablet; Take 1 tablet (5 mg total) by mouth daily.  Dispense: 90 tablet; Refill: 0 - losartan-hydrochlorothiazide (HYZAAR) 50-12.5 MG tablet; Take 1 tablet by mouth daily.  Dispense: 90 tablet; Refill: 0  2. Gastroesophageal reflux disease, esophagitis presence not specified  - omeprazole (PRILOSEC OTC) 20 MG tablet; Take 1 tablet (20 mg total) by mouth daily.  Dispense: 90 tablet; Refill: 0  3. Hyperlipidemia, unspecified hyperlipidemia type FLP at goal, repeat in 3 months - atorvastatin (LIPITOR) 20 MG tablet; Take 1 tablet (20 mg total) by mouth daily at 6 PM.  Dispense: 90 tablet; Refill: 0   [name] [facility] 01/20/2017 8:42 AM

## 2017-03-12 ENCOUNTER — Other Ambulatory Visit: Payer: Self-pay | Admitting: Family Medicine

## 2017-03-12 DIAGNOSIS — G250 Essential tremor: Secondary | ICD-10-CM

## 2017-04-05 ENCOUNTER — Other Ambulatory Visit: Payer: Self-pay | Admitting: Family Medicine

## 2017-04-05 DIAGNOSIS — I10 Essential (primary) hypertension: Secondary | ICD-10-CM

## 2017-04-05 NOTE — Telephone Encounter (Signed)
Nasal corticosteroid approved He should not be out of ARB-thiazide Cr bumped last visit, so primary may be seeing him, rechecking before refill as he has an appt in May

## 2017-04-18 ENCOUNTER — Encounter: Payer: Self-pay | Admitting: Family Medicine

## 2017-04-18 ENCOUNTER — Ambulatory Visit (INDEPENDENT_AMBULATORY_CARE_PROVIDER_SITE_OTHER): Payer: Medicare Other | Admitting: Family Medicine

## 2017-04-18 VITALS — BP 132/77 | HR 68 | Temp 97.6°F | Resp 16 | Ht 73.0 in | Wt 209.2 lb

## 2017-04-18 DIAGNOSIS — E785 Hyperlipidemia, unspecified: Secondary | ICD-10-CM | POA: Diagnosis not present

## 2017-04-18 DIAGNOSIS — R739 Hyperglycemia, unspecified: Secondary | ICD-10-CM | POA: Diagnosis not present

## 2017-04-18 DIAGNOSIS — K219 Gastro-esophageal reflux disease without esophagitis: Secondary | ICD-10-CM

## 2017-04-18 DIAGNOSIS — I10 Essential (primary) hypertension: Secondary | ICD-10-CM | POA: Diagnosis not present

## 2017-04-18 LAB — POCT GLYCOSYLATED HEMOGLOBIN (HGB A1C): Hemoglobin A1C: 5.3

## 2017-04-18 MED ORDER — LOSARTAN POTASSIUM-HCTZ 50-12.5 MG PO TABS: 1.0000 | ORAL_TABLET | Freq: Every day | ORAL | 0 refills | Status: DC

## 2017-04-18 MED ORDER — OMEPRAZOLE MAGNESIUM 20 MG PO TBEC: 20.0000 mg | DELAYED_RELEASE_TABLET | Freq: Every day | ORAL | 0 refills | Status: DC

## 2017-04-18 MED ORDER — AMLODIPINE BESYLATE 5 MG PO TABS: 5.0000 mg | ORAL_TABLET | Freq: Every day | ORAL | 0 refills | Status: DC

## 2017-04-18 MED ORDER — ATORVASTATIN CALCIUM 20 MG PO TABS
20.0000 mg | ORAL_TABLET | Freq: Every day | ORAL | 0 refills | Status: DC
Start: 2017-04-18 — End: 2017-07-18

## 2017-04-18 NOTE — Progress Notes (Signed)
Name: Zachary Meyer   MRN: [id number]    DOB: 1941/06/05   Date:04/18/2017       Progress Note  Subjective  Chief Complaint  Chief Complaint  Patient presents with  . Follow-up    3 mo  . Medication Refill    Hypertension  This is a chronic problem. The problem is unchanged. The problem is controlled. Pertinent negatives include no blurred vision, chest pain, headaches, palpitations or shortness of breath. Past treatments include angiotensin blockers, diuretics and calcium channel blockers. There is no history of kidney disease, CAD/MI or CVA.  Hyperlipidemia  This is a chronic problem. The problem is uncontrolled. Recent lipid tests were reviewed and are normal. Pertinent negatives include no chest pain, leg pain, myalgias or shortness of breath. Current antihyperlipidemic treatment includes statins.  Gastroesophageal Reflux  He complains of heartburn. He reports no chest pain or no coughing. This is a chronic problem. The problem has been unchanged. The symptoms are aggravated by certain foods (greasy food bring on the symptoms.). He has tried a PPI for the symptoms. The treatment provided significant relief. Past procedures do not include an EGD.     Past Medical History:  Diagnosis Date  . GERD (gastroesophageal reflux disease)   . Hyperlipidemia   . Hypertension     Past Surgical History:  Procedure Laterality Date  . MELANOMA EXCISION Left 12/2012    Family History  Problem Relation Age of Onset  . Hypertension Sister   . Hypertension Brother     Social History   Social History  . Marital status: Married    Spouse name: N/A  . Number of children: N/A  . Years of education: N/A   Occupational History  . Not on file.   Social History Main Topics  . Smoking status: Former Research scientist (life sciences)  . Smokeless tobacco: Never Used  . Alcohol use No  . Drug use: No  . Sexual activity: No   Other Topics Concern  . Not on file   Social History Narrative  . No narrative on  file     Current Outpatient Prescriptions:  .  amLODipine (NORVASC) 5 MG tablet, Take 1 tablet (5 mg total) by mouth daily., Disp: 90 tablet, Rfl: 0 .  atorvastatin (LIPITOR) 20 MG tablet, Take 1 tablet (20 mg total) by mouth daily at 6 PM., Disp: 90 tablet, Rfl: 0 .  fluticasone (FLONASE) 50 MCG/ACT nasal spray, PLACE 2 SPRAYS INTO BOTH NOSTRILS DAILY., Disp: 16 g, Rfl: 0 .  losartan-hydrochlorothiazide (HYZAAR) 50-12.5 MG tablet, Take 1 tablet by mouth daily., Disp: 90 tablet, Rfl: 0 .  omeprazole (PRILOSEC OTC) 20 MG tablet, Take 1 tablet (20 mg total) by mouth daily., Disp: 90 tablet, Rfl: 0 .  propranolol (INDERAL) 20 MG tablet, TAKE 1 TABLET (20 MG TOTAL) BY MOUTH 2 (TWO) TIMES DAILY., Disp: 60 tablet, Rfl: 2  No Known Allergies   Review of Systems  Eyes: Negative for blurred vision.  Respiratory: Negative for cough and shortness of breath.   Cardiovascular: Negative for chest pain and palpitations.  Gastrointestinal: Positive for heartburn.  Musculoskeletal: Negative for myalgias.  Neurological: Negative for headaches.      Objective  Vitals:   04/18/17 0822  BP: 132/77  Pulse: 68  Resp: 16  Temp: 97.6 F (36.4 C)  TempSrc: Oral  SpO2: 96%  Weight: 209 lb 3.2 oz (94.9 kg)  Height: 6\' 1"  (1.854 m)    Physical Exam  Constitutional: He is oriented to person, place,  and time and well-developed, well-nourished, and in no distress.  HENT:  Head: Normocephalic and atraumatic.  Eyes: Conjunctivae are normal. Pupils are equal, round, and reactive to light.  Cardiovascular: Normal rate, regular rhythm and normal heart sounds.   No murmur heard. Pulmonary/Chest: Effort normal and breath sounds normal. No respiratory distress. He has no wheezes. He has no rhonchi.  Abdominal: Soft. Bowel sounds are normal. There is no tenderness.  Musculoskeletal: He exhibits no edema.  Neurological: He is alert and oriented to person, place, and time.  Psychiatric: Memory, affect and  judgment normal.  Nursing note and vitals reviewed.   Assessment & Plan  1. Essential hypertension BP stable on present antihypertensive therapy - losartan-hydrochlorothiazide (HYZAAR) 50-12.5 MG tablet; Take 1 tablet by mouth daily.  Dispense: 90 tablet; Refill: 0 - amLODipine (NORVASC) 5 MG tablet; Take 1 tablet (5 mg total) by mouth daily.  Dispense: 90 tablet; Refill: 0  2. Gastroesophageal reflux disease, esophagitis presence not specified  - omeprazole (PRILOSEC OTC) 20 MG tablet; Take 1 tablet (20 mg total) by mouth daily.  Dispense: 90 tablet; Refill: 0  3. Hyperlipidemia, unspecified hyperlipidemia type Stable, obtain FLP and adjust statin if necessary - atorvastatin (LIPITOR) 20 MG tablet; Take 1 tablet (20 mg total) by mouth daily at 6 PM.  Dispense: 90 tablet; Refill: 0 - Lipid panel - COMPLETE METABOLIC PANEL WITH GFR  4. Hyperglycemia  A1c is 5.3%, considered normal - POCT HgB A1C   Johnetta Sloniker Asad A. [facility] 04/18/2017 8:34 AM

## 2017-04-19 LAB — LIPID PANEL
Cholesterol: 156 mg/dL (ref <200)
HDL: 32 mg/dL — ABNORMAL LOW (ref >40)
LDL Cholesterol: 99 mg/dL (ref <100)
Total CHOL/HDL Ratio: 4.9 Ratio (ref <5.0)
Triglycerides: 125 mg/dL (ref <150)
VLDL: 25 mg/dL (ref <30)

## 2017-04-19 LAB — COMPLETE METABOLIC PANEL WITH GFR
ALBUMIN: 4.4 g/dL (ref 3.6–5.1)
ALK PHOS: 90 U/L (ref 40–115)
ALT: 18 U/L (ref 9–46)
AST: 14 U/L (ref 10–35)
BILIRUBIN TOTAL: 0.6 mg/dL (ref 0.2–1.2)
BUN: 17 mg/dL (ref 7–25)
CO2: 25 mmol/L (ref 20–31)
CREATININE: 1.32 mg/dL — AB (ref 0.70–1.18)
Calcium: 9.5 mg/dL (ref 8.6–10.3)
Chloride: 102 mmol/L (ref 98–110)
GFR, Est African American: 61 mL/min (ref >=60)
GFR, Est Non African American: 53 mL/min — ABNORMAL LOW (ref >=60)
GLUCOSE: 109 mg/dL — AB (ref 65–99)
Potassium: 4.2 mmol/L (ref 3.5–5.3)
SODIUM: 138 mmol/L (ref 135–146)
TOTAL PROTEIN: 7.1 g/dL (ref 6.1–8.1)

## 2017-05-31 ENCOUNTER — Other Ambulatory Visit: Payer: Self-pay | Admitting: Family Medicine

## 2017-05-31 DIAGNOSIS — G250 Essential tremor: Secondary | ICD-10-CM

## 2017-06-02 NOTE — Telephone Encounter (Signed)
Last BP and pulse reviewed; Rx approved 

## 2017-06-27 ENCOUNTER — Other Ambulatory Visit: Payer: Self-pay | Admitting: Family Medicine

## 2017-06-30 ENCOUNTER — Other Ambulatory Visit: Payer: Self-pay | Admitting: Family Medicine

## 2017-06-30 DIAGNOSIS — G250 Essential tremor: Secondary | ICD-10-CM

## 2017-07-12 ENCOUNTER — Other Ambulatory Visit: Payer: Self-pay | Admitting: Family Medicine

## 2017-07-12 DIAGNOSIS — K219 Gastro-esophageal reflux disease without esophagitis: Secondary | ICD-10-CM

## 2017-07-15 ENCOUNTER — Other Ambulatory Visit: Payer: Self-pay | Admitting: Family Medicine

## 2017-07-15 DIAGNOSIS — I10 Essential (primary) hypertension: Secondary | ICD-10-CM

## 2017-07-18 ENCOUNTER — Ambulatory Visit (INDEPENDENT_AMBULATORY_CARE_PROVIDER_SITE_OTHER): Payer: Medicare Other | Admitting: Family Medicine

## 2017-07-18 ENCOUNTER — Encounter: Payer: Self-pay | Admitting: Family Medicine

## 2017-07-18 VITALS — BP 140/78 | HR 67 | Temp 97.8°F | Resp 16 | Ht 73.0 in | Wt 214.3 lb

## 2017-07-18 DIAGNOSIS — I10 Essential (primary) hypertension: Secondary | ICD-10-CM

## 2017-07-18 DIAGNOSIS — K219 Gastro-esophageal reflux disease without esophagitis: Secondary | ICD-10-CM | POA: Diagnosis not present

## 2017-07-18 DIAGNOSIS — E785 Hyperlipidemia, unspecified: Secondary | ICD-10-CM | POA: Diagnosis not present

## 2017-07-18 MED ORDER — LOSARTAN POTASSIUM-HCTZ 50-12.5 MG PO TABS: 1.0000 | ORAL_TABLET | Freq: Every day | ORAL | 0 refills | Status: DC

## 2017-07-18 MED ORDER — AMLODIPINE BESYLATE 5 MG PO TABS: 5.0000 mg | ORAL_TABLET | Freq: Every day | ORAL | 0 refills | Status: DC

## 2017-07-18 MED ORDER — ATORVASTATIN CALCIUM 20 MG PO TABS: 20.0000 mg | ORAL_TABLET | Freq: Every day | ORAL | 0 refills | Status: DC

## 2017-07-18 NOTE — Addendum Note (Signed)
Addended byManuella Ghazi, Jeannia Tatro ASAD A on: 07/18/2017 06:12 PM   Modules accepted: Level of Service

## 2017-07-18 NOTE — Progress Notes (Signed)
Name: Zachary Meyer   MRN: [id number]    DOB: [birth year]   Date:07/18/2017       Progress Note  Subjective  Chief Complaint  Chief Complaint  Patient presents with  . Medication Refill    Amlodipine, lipior, losartan    Hypertension  This is a chronic problem. The problem is unchanged. The problem is controlled. Pertinent negatives include no blurred vision, chest pain, headaches, palpitations or shortness of breath. Past treatments include angiotensin blockers, diuretics and calcium channel blockers. There is no history of kidney disease, CAD/MI or CVA.  Hyperlipidemia  This is a chronic problem. The problem is uncontrolled. Recent lipid tests were reviewed and are normal. Pertinent negatives include no chest pain, leg pain, myalgias or shortness of breath. Current antihyperlipidemic treatment includes statins.  Gastroesophageal Reflux  He complains of heartburn. He reports no chest pain or no coughing. This is a chronic problem. The problem has been unchanged. The symptoms are aggravated by certain foods (greasy food bring on the symptoms.). He has tried a PPI for the symptoms. The treatment provided significant relief. Past procedures do not include an EGD.      Past Medical History:  Diagnosis Date  . GERD (gastroesophageal reflux disease)   . Hyperlipidemia   . Hypertension     Past Surgical History:  Procedure Laterality Date  . MELANOMA EXCISION Left 12/2012    Family History  Problem Relation Age of Onset  . Hypertension Sister   . Hypertension Brother   . Heart attack Mother     Social History   Social History  . Marital status: Married    Spouse name: N/A  . Number of children: N/A  . Years of education: N/A   Occupational History  . Not on file.   Social History Main Topics  . Smoking status: Former Research scientist (life sciences)  . Smokeless tobacco: Never Used  . Alcohol use No  . Drug use: No  . Sexual activity: No   Other Topics Concern  . Not on file   Social  History Narrative  . No narrative on file     Current Outpatient Prescriptions:  .  amLODipine (NORVASC) 5 MG tablet, Take 1 tablet (5 mg total) by mouth daily., Disp: 90 tablet, Rfl: 0 .  atorvastatin (LIPITOR) 20 MG tablet, Take 1 tablet (20 mg total) by mouth daily at 6 PM., Disp: 90 tablet, Rfl: 0 .  fluticasone (FLONASE) 50 MCG/ACT nasal spray, SPRAY 2 SPRAYS INTO EACH NOSTRIL EVERY DAY, Disp: 16 g, Rfl: 2 .  losartan-hydrochlorothiazide (HYZAAR) 50-12.5 MG tablet, Take 1 tablet by mouth daily., Disp: 90 tablet, Rfl: 0 .  omeprazole (PRILOSEC) 20 MG capsule, TAKE 1 CAPSULE BY MOUTH EVERY DAY, Disp: 90 capsule, Rfl: 0 .  propranolol (INDERAL) 20 MG tablet, TAKE 1 TABLET BY MOUTH TWICE A DAY, Disp: 60 tablet, Rfl: 1  No Known Allergies   Review of Systems  Eyes: Negative for blurred vision.  Respiratory: Negative for cough and shortness of breath.   Cardiovascular: Negative for chest pain and palpitations.  Gastrointestinal: Positive for heartburn.  Musculoskeletal: Negative for myalgias.  Neurological: Negative for headaches.      Objective  Vitals:   07/18/17 0927  BP: 140/78  Pulse: 67  Resp: 16  Temp: 97.8 F (36.6 C)  TempSrc: Oral  SpO2: 95%  Weight: 214 lb 4.8 oz (97.2 kg)  Height: 6\' 1"  (1.854 m)    Physical Exam  Constitutional: He is oriented to person, place, and  time and well-developed, well-nourished, and in no distress.  HENT:  Head: Normocephalic and atraumatic.  Eyes: Pupils are equal, round, and reactive to light. Conjunctivae are normal.  Cardiovascular: Normal rate, regular rhythm and normal heart sounds.   No murmur heard. Pulmonary/Chest: Effort normal and breath sounds normal. No respiratory distress. He has no wheezes. He has no rhonchi.  Abdominal: Soft. Bowel sounds are normal. There is no tenderness.  Musculoskeletal: He exhibits no edema.  Neurological: He is alert and oriented to person, place, and time.  Psychiatric: Memory, affect  and judgment normal.  Nursing note and vitals reviewed.    Assessment & Plan  1. Essential hypertension BP stable on present antihypertensive treatment - amLODipine (NORVASC) 5 MG tablet; Take 1 tablet (5 mg total) by mouth daily.  Dispense: 90 tablet; Refill: 0 - losartan-hydrochlorothiazide (HYZAAR) 50-12.5 MG tablet; Take 1 tablet by mouth daily.  Dispense: 90 tablet; Refill: 0  2. Hyperlipidemia, unspecified hyperlipidemia type  - atorvastatin (LIPITOR) 20 MG tablet; Take 1 tablet (20 mg total) by mouth daily at 6 PM.  Dispense: 90 tablet; Refill: 0  3. Gastroesophageal reflux disease, esophagitis presence not specified Symptoms stable and controlled on PPI  [name] Asad A. [facility] 07/18/2017 9:29 AM

## 2017-08-05 ENCOUNTER — Ambulatory Visit
Admission: RE | Admit: 2017-08-05 | Discharge: 2017-08-05 | Disposition: A | Discharge status: Home / Self Care | Payer: Medicare Other | Source: Ambulatory Visit | Attending: Family Medicine | Admitting: Family Medicine

## 2017-08-05 ENCOUNTER — Ambulatory Visit (INDEPENDENT_AMBULATORY_CARE_PROVIDER_SITE_OTHER): Payer: Medicare Other | Admitting: Family Medicine

## 2017-08-05 ENCOUNTER — Encounter: Payer: Self-pay | Admitting: Family Medicine

## 2017-08-05 VITALS — BP 124/78 | HR 83 | Temp 97.9°F | Resp 16 | Ht 73.0 in | Wt 212.8 lb

## 2017-08-05 DIAGNOSIS — G8911 Acute pain due to trauma: Secondary | ICD-10-CM | POA: Diagnosis not present

## 2017-08-05 DIAGNOSIS — M79621 Pain in right upper arm: Secondary | ICD-10-CM

## 2017-08-05 DIAGNOSIS — M19011 Primary osteoarthritis, right shoulder: Secondary | ICD-10-CM | POA: Insufficient documentation

## 2017-08-05 DIAGNOSIS — M25511 Pain in right shoulder: Secondary | ICD-10-CM | POA: Insufficient documentation

## 2017-08-05 NOTE — Progress Notes (Signed)
Name: Zachary Meyer   MRN: [id number]    DOB: 04-25-1941   Date:08/05/2017       Progress Note  Subjective  Chief Complaint  Chief Complaint  Patient presents with  . Fall    Right shoulder, not able to lift his shoulder on his on. [address] a week ago and its very sore.     Fall  The accident occurred more than 1 week ago (9 days ago). Fall occurred: fell in his barn on a dirt surface. He fell from a height of 6 to 10 ft. He landed on dirt. There was no blood loss. The point of impact was the right shoulder. The pain is present in the right shoulder and right upper arm. The pain is at a severity of 0/10 (9/10 pain when he tries to raise his right arm). The symptoms are aggravated by use of injured limb and extension (trying to riase the right arm makes it worse.). He has tried acetaminophen for the symptoms.    Past Medical History:  Diagnosis Date  . GERD (gastroesophageal reflux disease)   . Hyperlipidemia   . Hypertension     Past Surgical History:  Procedure Laterality Date  . MELANOMA EXCISION Left 12/2012    Family History  Problem Relation Age of Onset  . Hypertension Sister   . Hypertension Brother   . Heart attack Mother     Social History   Social History  . Marital status: Married    Spouse name: N/A  . Number of children: N/A  . Years of education: N/A   Occupational History  . Not on file.   Social History Main Topics  . Smoking status: Former Research scientist (life sciences)  . Smokeless tobacco: Never Used  . Alcohol use No  . Drug use: No  . Sexual activity: No   Other Topics Concern  . Not on file   Social History Narrative  . No narrative on file     Current Outpatient Prescriptions:  .  amLODipine (NORVASC) 5 MG tablet, Take 1 tablet (5 mg total) by mouth daily., Disp: 90 tablet, Rfl: 0 .  atorvastatin (LIPITOR) 20 MG tablet, Take 1 tablet (20 mg total) by mouth daily at 6 PM., Disp: 90 tablet, Rfl: 0 .  fluticasone (FLONASE) 50 MCG/ACT nasal spray, SPRAY 2  SPRAYS INTO EACH NOSTRIL EVERY DAY, Disp: 16 g, Rfl: 2 .  losartan-hydrochlorothiazide (HYZAAR) 50-12.5 MG tablet, Take 1 tablet by mouth daily., Disp: 90 tablet, Rfl: 0 .  omeprazole (PRILOSEC) 20 MG capsule, TAKE 1 CAPSULE BY MOUTH EVERY DAY, Disp: 90 capsule, Rfl: 0 .  propranolol (INDERAL) 20 MG tablet, TAKE 1 TABLET BY MOUTH TWICE A DAY, Disp: 60 tablet, Rfl: 1  No Known Allergies   ROS  Please see history of present illness for complete description of ROS  Objective  Vitals:   08/05/17 1429  BP: 124/78  Pulse: 83  Resp: 16  Temp: 97.9 F (36.6 C)  TempSrc: Oral  SpO2: 96%  Weight: 212 lb 12.8 oz (96.5 kg)  Height: 6\' 1"  (1.854 m)    Physical Exam  Constitutional: He is well-developed, well-nourished, and in no distress.  Musculoskeletal:       Right shoulder: He exhibits decreased range of motion, tenderness and pain. He exhibits no swelling.       Arms: Tenderness in right shoulder joint with external rotation. Unable to raise the right shoulder and arm to complete empty can test.  Psychiatric: Mood, memory, affect and  judgment normal.  Nursing note and vitals reviewed.     Assessment & Plan  1. Pain of right upper arm Likely from contusion sustained by the fall, obtain x-rays to rule out acute osseous pathology - DG Humerus Right; Future  2. Acute pain of right shoulder due to trauma Concerned about rotator cuff injury because of inability to lift right arm past midline, obtain x-rays, advised to continue on Tylenol for pain relief. May need MRI in future - DG Shoulder Right; Future   Samanthamarie Ezzell Asad A. [facility] 08/05/2017 2:40 PM

## 2017-08-22 ENCOUNTER — Other Ambulatory Visit: Payer: Self-pay | Admitting: Family Medicine

## 2017-08-22 DIAGNOSIS — G250 Essential tremor: Secondary | ICD-10-CM

## 2017-09-08 ENCOUNTER — Ambulatory Visit (INDEPENDENT_AMBULATORY_CARE_PROVIDER_SITE_OTHER): Payer: Medicare Other

## 2017-09-08 DIAGNOSIS — Z23 Encounter for immunization: Secondary | ICD-10-CM | POA: Diagnosis not present

## 2017-09-19 ENCOUNTER — Ambulatory Visit (INDEPENDENT_AMBULATORY_CARE_PROVIDER_SITE_OTHER): Payer: Medicare Other

## 2017-09-19 VITALS — BP 138/82 | HR 80 | Temp 98.7°F | Resp 16 | Ht 72.0 in | Wt 214.5 lb

## 2017-09-19 DIAGNOSIS — Z0000 Encounter for general adult medical examination without abnormal findings: Secondary | ICD-10-CM | POA: Diagnosis not present

## 2017-09-19 NOTE — Patient Instructions (Signed)
Zachary Meyer , Thank you for taking time to come for your Medicare Wellness Visit. I appreciate your ongoing commitment to your health goals. Please review the following plan we discussed and let me know if I can assist you in the future.   Screening recommendations/referrals: Colonoscopy: Completed 11/29/13. You stated that you are due to repeat this exam January 2019. Please call your gastroenterologist to schedule your exam. Recommended yearly ophthalmology/optometry visit for glaucoma screening and checkup Recommended yearly dental visit for hygiene and checkup  Vaccinations: Influenza vaccine: Completed 09/08/17 Pneumococcal vaccine: Due for Prevnar 13. Declined today due to previous cost of Pneumovax 23. Please call your insurance company to determine your out of pocket expense for Prevnar 13 Tdap vaccine: Declined. Please call your insurance company to determine your out of pocket expense. Shingles vaccine: Declined. Please call your insurance company to determine your out of pocket expense.    Advanced directives: Advance directive discussed with you today. Even though you declined this today please call our office should you change your mind and we can give you the proper paperwork for you to fill out.  Conditions/risks identified: Recommend to reduce portion size of meals to 3 small healthy meals per day and 2 healthy snacks per day  Next appointment: Scheduled to see Dr. Manuella Ghazi on 10/20/17 @ 10:20am. Please schedule your annual wellness exam with the Nurse Health Advisor in one year.  Preventive Care 74 Years and Older, Male Preventive care refers to lifestyle choices and visits with your health care provider that can promote health and wellness. What does preventive care include?  A yearly physical exam. This is also called an annual well check.  Dental exams once or twice a year.  Routine eye exams. Ask your health care provider how often you should have your eyes  checked.  Personal lifestyle choices, including:  Daily care of your teeth and gums.  Regular physical activity.  Eating a healthy diet.  Avoiding tobacco and drug use.  Limiting alcohol use.  Practicing safe sex.  Taking low doses of aspirin every day.  Taking vitamin and mineral supplements as recommended by your health care provider. What happens during an annual well check? The services and screenings done by your health care provider during your annual well check will depend on your age, overall health, lifestyle risk factors, and family history of disease. Counseling  Your health care provider may ask you questions about your:  Alcohol use.  Tobacco use.  Drug use.  Emotional well-being.  Home and relationship well-being.  Sexual activity.  Eating habits.  History of falls.  Memory and ability to understand (cognition).  Work and work Statistician. Screening  You may have the following tests or measurements:  Height, weight, and BMI.  Blood pressure.  Lipid and cholesterol levels. These may be checked every 5 years, or more frequently if you are over 48 years old.  Skin check.  Lung cancer screening. You may have this screening every year starting at age 56 if you have a 30-pack-year history of smoking and currently smoke or have quit within the past 15 years.  Fecal occult blood test (FOBT) of the stool. You may have this test every year starting at age 43.  Flexible sigmoidoscopy or colonoscopy. You may have a sigmoidoscopy every 5 years or a colonoscopy every 10 years starting at age 75.  Prostate cancer screening. Recommendations will vary depending on your family history and other risks.  Hepatitis C blood test.  Hepatitis B  blood test.  Sexually transmitted disease (STD) testing.  Diabetes screening. This is done by checking your blood sugar (glucose) after you have not eaten for a while (fasting). You may have this done every 1-3  years.  Abdominal aortic aneurysm (AAA) screening. You may need this if you are a current or former smoker.  Osteoporosis. You may be screened starting at age 51 if you are at high risk. Talk with your health care provider about your test results, treatment options, and if necessary, the need for more tests. Vaccines  Your health care provider may recommend certain vaccines, such as:  Influenza vaccine. This is recommended every year.  Tetanus, diphtheria, and acellular pertussis (Tdap, Td) vaccine. You may need a Td booster every 10 years.  Zoster vaccine. You may need this after age 31.  Pneumococcal 13-valent conjugate (PCV13) vaccine. One dose is recommended after age 78.  Pneumococcal polysaccharide (PPSV23) vaccine. One dose is recommended after age 24. Talk to your health care provider about which screenings and vaccines you need and how often you need them. This information is not intended to replace advice given to you by your health care provider. Make sure you discuss any questions you have with your health care provider. Document Released: 12/08/2015 Document Revised: 07/31/2016 Document Reviewed: 09/12/2015 Elsevier Interactive Patient Education  2017 [place] Prevention in the Home Falls can cause injuries. They can happen to people of all ages. There are many things you can do to make your home safe and to help prevent falls. What can I do on the outside of my home?  Regularly fix the edges of walkways and driveways and fix any cracks.  Remove anything that might make you trip as you walk through a door, such as a raised step or threshold.  Trim any bushes or trees on the path to your home.  Use bright outdoor lighting.  Clear any walking paths of anything that might make someone trip, such as rocks or tools.  Regularly check to see if handrails are loose or broken. Make sure that both sides of any steps have handrails.  Any raised decks and porches  should have guardrails on the edges.  Have any leaves, snow, or ice cleared regularly.  Use sand or salt on walking paths during winter.  Clean up any spills in your garage right away. This includes oil or grease spills. What can I do in the bathroom?  Use night lights.  Install grab bars by the toilet and in the tub and shower. Do not use towel bars as grab bars.  Use non-skid mats or decals in the tub or shower.  If you need to sit down in the shower, use a plastic, non-slip stool.  Keep the floor dry. Clean up any water that spills on the floor as soon as it happens.  Remove soap buildup in the tub or shower regularly.  Attach bath mats securely with double-sided non-slip rug tape.  Do not have throw rugs and other things on the floor that can make you trip. What can I do in the bedroom?  Use night lights.  Make sure that you have a light by your bed that is easy to reach.  Do not use any sheets or blankets that are too big for your bed. They should not hang down onto the floor.  Have a firm chair that has side arms. You can use this for support while you get dressed.  Do not have throw  rugs and other things on the floor that can make you trip. What can I do in the kitchen?  Clean up any spills right away.  Avoid walking on wet floors.  Keep items that you use a lot in easy-to-reach places.  If you need to reach something above you, use a strong step stool that has a grab bar.  Keep electrical cords out of the way.  Do not use floor polish or wax that makes floors slippery. If you must use wax, use non-skid floor wax.  Do not have throw rugs and other things on the floor that can make you trip. What can I do with my stairs?  Do not leave any items on the stairs.  Make sure that there are handrails on both sides of the stairs and use them. Fix handrails that are broken or loose. Make sure that handrails are as long as the stairways.  Check any carpeting to  make sure that it is firmly attached to the stairs. Fix any carpet that is loose or worn.  Avoid having throw rugs at the top or bottom of the stairs. If you do have throw rugs, attach them to the floor with carpet tape.  Make sure that you have a light switch at the top of the stairs and the bottom of the stairs. If you do not have them, ask someone to add them for you. What else can I do to help prevent falls?  Wear shoes that:  Do not have high heels.  Have rubber bottoms.  Are comfortable and fit you well.  Are closed at the toe. Do not wear sandals.  If you use a stepladder:  Make sure that it is fully opened. Do not climb a closed stepladder.  Make sure that both sides of the stepladder are locked into place.  Ask someone to hold it for you, if possible.  Clearly mark and make sure that you can see:  Any grab bars or handrails.  First and last steps.  Where the edge of each step is.  Use tools that help you move around (mobility aids) if they are needed. These include:  Canes.  Walkers.  Scooters.  Crutches.  Turn on the lights when you go into a dark area. Replace any light bulbs as soon as they burn out.  Set up your furniture so you have a clear path. Avoid moving your furniture around.  If any of your floors are uneven, fix them.  If there are any pets around you, be aware of where they are.  Review your medicines with your doctor. Some medicines can make you feel dizzy. This can increase your chance of falling. Ask your doctor what other things that you can do to help prevent falls. This information is not intended to replace advice given to you by your health care provider. Make sure you discuss any questions you have with your health care provider. Document Released: 09/07/2009 Document Revised: 04/18/2016 Document Reviewed: 12/16/2014 Elsevier Interactive Patient Education  2017 [employer].

## 2017-09-19 NOTE — Progress Notes (Signed)
Subjective:   Zachary Meyer is a 76 y.o. male who presents for Medicare Annual/Subsequent preventive examination.  Review of Systems:  N/A Cardiac Risk Factors include: male gender;advanced age (>13men, >33 women);dyslipidemia;hypertension;sedentary lifestyle     Objective:    Vitals: BP 138/82 (BP Location: Left Arm, Cuff Size: Normal)   Pulse 80   Temp 98.7 F (37.1 C) (Oral)   Resp 16   Ht 6' (1.829 m)   Wt 214 lb 8 oz (97.3 kg)   BMI 29.09 kg/m   Body mass index is 29.09 kg/m.  Tobacco History  Smoking Status  . Former Smoker  Smokeless Tobacco  . Never Used     Counseling given: Not Answered   Past Medical History:  Diagnosis Date  . GERD (gastroesophageal reflux disease)   . Hyperlipidemia   . Hypertension    Past Surgical History:  Procedure Laterality Date  . MELANOMA EXCISION Left 12/2012   Family History  Problem Relation Age of Onset  . Hypertension Sister   . Hypertension Brother   . Heart attack Mother    History  Sexual Activity  . Sexual activity: No    Outpatient Encounter Prescriptions as of 09/19/2017  Medication Sig  . amLODipine (NORVASC) 5 MG tablet Take 1 tablet (5 mg total) by mouth daily.  Marland Kitchen atorvastatin (LIPITOR) 20 MG tablet Take 1 tablet (20 mg total) by mouth daily at 6 PM.  . fluticasone (FLONASE) 50 MCG/ACT nasal spray SPRAY 2 SPRAYS INTO EACH NOSTRIL EVERY DAY  . losartan-hydrochlorothiazide (HYZAAR) 50-12.5 MG tablet Take 1 tablet by mouth daily.  Marland Kitchen omeprazole (PRILOSEC) 20 MG capsule TAKE 1 CAPSULE BY MOUTH EVERY DAY  . propranolol (INDERAL) 20 MG tablet TAKE 1 TABLET BY MOUTH TWICE A DAY   No facility-administered encounter medications on file as of 09/19/2017.     Activities of Daily Living In your present state of health, do you have any difficulty performing the following activities: 09/19/2017 08/05/2017  Hearing? N N  Vision? N Y  Comment - -  Difficulty concentrating or making decisions? N N  Walking  or climbing stairs? N N  Dressing or bathing? N N  Doing errands, shopping? N N  Preparing Food and eating ? N -  Using the Toilet? N -  In the past six months, have you accidently leaked urine? N -  Do you have problems with loss of bowel control? N -  Managing your Medications? N -  Managing your Finances? N -  Housekeeping or managing your Housekeeping? N -  Some recent data might be hidden    Patient Care Team: Roselee Nova, MD as PCP - General (Family Medicine) Dasher, Rayvon Char, MD as Consulting Physician (Dermatology)   Assessment:     Exercise Activities and Dietary recommendations Current Exercise Habits: The patient does not participate in regular exercise at present, Exercise limited by: None identified  Goals    . Reduce portion size          Recommend to reduce portion size of meals to 3 small healthy meals per day and 2 healthy snacks per day      Fall Risk: TUG test score = 10sec Fall Risk  09/19/2017 08/05/2017 07/18/2017 04/18/2017 10/24/2016  Falls in the past year? Yes Yes No No No  Number falls in past yr: 1 1 - - -  Injury with Fall? Yes No - - -  Comment soft tissue injury to R shoulder - - - -  Follow up Education provided;Falls prevention discussed - - - -   Depression Screen PHQ 2/9 Scores 09/19/2017 08/05/2017 07/18/2017 04/18/2017  PHQ - 2 Score 0 0 0 0    Cognitive Function     6CIT Screen 09/19/2017  What Year? 0 points  What time? 0 points  Count back from 20 0 points  Months in reverse 0 points  Repeat phrase 4 points    Immunization History  Administered Date(s) Administered  . Influenza, High Dose Seasonal PF 08/27/2016, 09/08/2017  . Pneumococcal Polysaccharide-23 08/27/2016  . Zoster 10/24/2016   Screening Tests Health Maintenance  Topic Date Due  . TETANUS/TDAP  11/05/1962  . PNA vac Low Risk Adult (2 of 2 - PCV13) 08/27/2017  . COLONOSCOPY  11/30/2023  . INFLUENZA VACCINE  Completed      Plan:   I have personally  reviewed and addressed the Medicare Annual Wellness questionnaire and have noted the following in the patient's chart:  A. Medical and social history B. Use of alcohol, tobacco or illicit drugs  C. Current medications and supplements D. Functional ability and status E.  Nutritional status F.  Physical activity G. Advance directives and Code Status: Pt provided with DNR and MOST forms for review and completion. H. List of other physicians I.  Hospitalizations, surgeries, and ER visits in previous 12 months J.  [place] such as hearing and vision if needed, cognitive and depression L. Referrals and appointments - none  In addition, I have reviewed and discussed with patient certain preventive protocols, quality metrics, and best practice recommendations. A written personalized care plan for preventive services as well as general preventive health recommendations were provided to patient.  See attached scanned questionnaire for additional information.   Signed,  Aleatha Borer, LPN Nurse Health Advisor   Recommendations for Immunizations per CDC guidelines:  Vaccinations: Influenza vaccine: Completed 09/08/17 Pneumococcal vaccine: Due for Prevnar 13. Declined today due to previous cost of Pneumovax 23. Pt advised to call his insurance company to determine his out of pocket expense for Prevnar 13. Tdap vaccine: Declined today. Advised pt to call his insurance company to determine his out of pocket expense. Shingles vaccine: Declined today. Advised pt to call his insurance company to determine his out of pocket expense.   Recommendations for Health Maintenance Screenings:  Screenings: Colonoscopy: Completed 11/29/13. Pt stated he is due to repeat this exam January 2019. Pt advised to call his gastroenterologist to schedule his exam. Recommended yearly ophthalmology/optometry visit for glaucoma screening and checkup Recommended yearly dental visit for hygiene and  checkup

## 2017-09-22 ENCOUNTER — Telehealth: Payer: Self-pay

## 2017-09-22 NOTE — Telephone Encounter (Signed)
Pt is requesting lower-cost alternate for cholesterol; simvastatin 40 mg tab, cost is $10.00 instead of the Lipitor 20 mg tab at $20.00

## 2017-09-23 ENCOUNTER — Ambulatory Visit: Payer: Medicare Other

## 2017-09-23 NOTE — Telephone Encounter (Signed)
Simvastatin is not appropriate as it interacts with Amlodipine, that patient is taking for hypertension.  He may check with the pharmacy regarding Rosuvastatin

## 2017-09-24 NOTE — Telephone Encounter (Signed)
Spoke to the pt and his wife and they stated they did not want any other medication and they are satisfied with what they have. I mention to them that I received a message from the pharmacy and they should call and speak to someone from there regarding this. They agreed

## 2017-09-26 ENCOUNTER — Other Ambulatory Visit: Payer: Self-pay | Admitting: Family Medicine

## 2017-10-20 ENCOUNTER — Ambulatory Visit: Payer: Medicare Other | Admitting: Family Medicine

## 2017-10-20 ENCOUNTER — Ambulatory Visit: Payer: Medicare Other

## 2017-10-20 ENCOUNTER — Encounter: Payer: Self-pay | Admitting: Family Medicine

## 2017-10-20 VITALS — BP 124/76 | HR 74 | Temp 97.7°F | Resp 16 | Wt 213.0 lb

## 2017-10-20 DIAGNOSIS — K219 Gastro-esophageal reflux disease without esophagitis: Secondary | ICD-10-CM

## 2017-10-20 DIAGNOSIS — G250 Essential tremor: Secondary | ICD-10-CM | POA: Diagnosis not present

## 2017-10-20 DIAGNOSIS — J310 Chronic rhinitis: Secondary | ICD-10-CM | POA: Diagnosis not present

## 2017-10-20 DIAGNOSIS — I10 Essential (primary) hypertension: Secondary | ICD-10-CM | POA: Diagnosis not present

## 2017-10-20 DIAGNOSIS — E785 Hyperlipidemia, unspecified: Secondary | ICD-10-CM | POA: Diagnosis not present

## 2017-10-20 LAB — LIPID PANEL
CHOL/HDL RATIO: 4.9 (calc) (ref <5.0)
CHOLESTEROL: 171 mg/dL (ref <200)
HDL: 35 mg/dL — AB (ref >40)
LDL Cholesterol (Calc): 114 mg/dL (calc) — ABNORMAL HIGH
NON-HDL CHOLESTEROL (CALC): 136 mg/dL — AB (ref <130)
TRIGLYCERIDES: 117 mg/dL (ref <150)

## 2017-10-20 MED ORDER — FLUTICASONE PROPIONATE 50 MCG/ACT NA SUSP: NASAL | 2 refills | Status: DC

## 2017-10-20 MED ORDER — AMLODIPINE BESYLATE 5 MG PO TABS: 5.0000 mg | ORAL_TABLET | Freq: Every day | ORAL | 0 refills | Status: DC

## 2017-10-20 MED ORDER — OMEPRAZOLE 20 MG PO CPDR: DELAYED_RELEASE_CAPSULE | ORAL | 0 refills | Status: DC

## 2017-10-20 MED ORDER — PROPRANOLOL HCL 20 MG PO TABS: 20.0000 mg | ORAL_TABLET | Freq: Two times a day (BID) | ORAL | 0 refills | Status: DC

## 2017-10-20 MED ORDER — ATORVASTATIN CALCIUM 20 MG PO TABS: 20.0000 mg | ORAL_TABLET | Freq: Every day | ORAL | 0 refills | Status: DC

## 2017-10-20 MED ORDER — LOSARTAN POTASSIUM-HCTZ 50-12.5 MG PO TABS: 1.0000 | ORAL_TABLET | Freq: Every day | ORAL | 0 refills | Status: DC

## 2017-10-20 NOTE — Progress Notes (Signed)
Name: Zachary Meyer   MRN: [id number]    DOB: 1941/03/25   Date:10/20/2017       Progress Note  Subjective  Chief Complaint  Chief Complaint  Patient presents with  . Medication Refill    acid reflux meds and all maintence meds   . Hypertension    f/u  . Hyperlipidemia    f/u  . Heartburn    f/u    Hypertension  This is a chronic problem. The problem is unchanged. The problem is controlled. Pertinent negatives include no blurred vision, chest pain, headaches, palpitations or shortness of breath. Past treatments include angiotensin blockers, diuretics and calcium channel blockers. There is no history of kidney disease, CAD/MI or CVA.  Hyperlipidemia  This is a chronic problem. The problem is controlled. Recent lipid tests were reviewed and are normal. Pertinent negatives include no chest pain, leg pain, myalgias or shortness of breath. Current antihyperlipidemic treatment includes statins.  Heartburn  He reports no abdominal pain, no belching, no chest pain, no coughing, no heartburn, no nausea or no sore throat. This is a chronic problem. The problem has been unchanged. He has tried a PPI for the symptoms. The treatment provided significant relief. Past procedures do not include an EGD.     Past Medical History:  Diagnosis Date  . GERD (gastroesophageal reflux disease)   . Hyperlipidemia   . Hypertension     Past Surgical History:  Procedure Laterality Date  . MELANOMA EXCISION Left 12/2012    Family History  Problem Relation Age of Onset  . Hypertension Sister   . Hypertension Brother   . Heart attack Mother     Social History   Socioeconomic History  . Marital status: Married    Spouse name: Not on file  . Number of children: Not on file  . Years of education: Not on file  . Highest education level: Not on file  Social Needs  . Financial resource strain: Not on file  . Food insecurity - worry: Not on file  . Food insecurity - inability: Not on file  .  Transportation needs - medical: Not on file  . Transportation needs - non-medical: Not on file  Occupational History  . Not on file  Tobacco Use  . Smoking status: Former Research scientist (life sciences)  . Smokeless tobacco: Never Used  Substance and Sexual Activity  . Alcohol use: No    Alcohol/week: 0.0 oz  . Drug use: No  . Sexual activity: No  Other Topics Concern  . Not on file  Social History Narrative  . Not on file     Current Outpatient Medications:  .  amLODipine (NORVASC) 5 MG tablet, Take 1 tablet (5 mg total) by mouth daily., Disp: 90 tablet, Rfl: 0 .  atorvastatin (LIPITOR) 20 MG tablet, Take 1 tablet (20 mg total) by mouth daily at 6 PM., Disp: 90 tablet, Rfl: 0 .  fluticasone (FLONASE) 50 MCG/ACT nasal spray, SPRAY 2 SPRAYS INTO EACH NOSTRIL EVERY DAY, Disp: 16 g, Rfl: 0 .  losartan-hydrochlorothiazide (HYZAAR) 50-12.5 MG tablet, Take 1 tablet by mouth daily., Disp: 90 tablet, Rfl: 0 .  omeprazole (PRILOSEC) 20 MG capsule, TAKE 1 CAPSULE BY MOUTH EVERY DAY, Disp: 90 capsule, Rfl: 0 .  propranolol (INDERAL) 20 MG tablet, TAKE 1 TABLET BY MOUTH TWICE A DAY, Disp: 60 tablet, Rfl: 1  No Known Allergies   Review of Systems  HENT: Negative for sore throat.   Eyes: Negative for blurred vision.  Respiratory: Negative  for cough and shortness of breath.   Cardiovascular: Negative for chest pain and palpitations.  Gastrointestinal: Negative for abdominal pain, heartburn and nausea.  Musculoskeletal: Negative for myalgias.  Neurological: Negative for headaches.    Objective  Vitals:   10/20/17 1028  BP: 124/76  Pulse: 74  Resp: 16  Temp: 97.7 F (36.5 C)  TempSrc: Oral  SpO2: 95%  Weight: 213 lb (96.6 kg)    Physical Exam  Constitutional: He is oriented to person, place, and time and well-developed, well-nourished, and in no distress.  HENT:  Head: Normocephalic and atraumatic.  Eyes: Conjunctivae are normal. Pupils are equal, round, and reactive to light.  Cardiovascular:  Normal rate, regular rhythm and normal heart sounds.  No murmur heard. Pulmonary/Chest: Effort normal and breath sounds normal. No respiratory distress. He has no wheezes. He has no rhonchi.  Abdominal: Soft. Bowel sounds are normal. There is no tenderness.  Musculoskeletal: He exhibits no edema.  Neurological: He is alert and oriented to person, place, and time.  Psychiatric: Memory, affect and judgment normal.  Nursing note and vitals reviewed.     Assessment & Plan  1. Benign essential tremor  - propranolol (INDERAL) 20 MG tablet; Take 1 tablet (20 mg total) by mouth 2 (two) times daily.  Dispense: 180 tablet; Refill: 0  2. Essential hypertension  - amLODipine (NORVASC) 5 MG tablet; Take 1 tablet (5 mg total) by mouth daily.  Dispense: 90 tablet; Refill: 0 - losartan-hydrochlorothiazide (HYZAAR) 50-12.5 MG tablet; Take 1 tablet by mouth daily.  Dispense: 90 tablet; Refill: 0  3. Gastroesophageal reflux disease, esophagitis presence not specified  - omeprazole (PRILOSEC) 20 MG capsule; TAKE 1 CAPSULE BY MOUTH EVERY DAY  Dispense: 90 capsule; Refill: 0  4. Hyperlipidemia, unspecified hyperlipidemia type  - atorvastatin (LIPITOR) 20 MG tablet; Take 1 tablet (20 mg total) by mouth daily at 6 PM.  Dispense: 90 tablet; Refill: 0 - Lipid panel  5. Chronic rhinitis  - fluticasone (FLONASE) 50 MCG/ACT nasal spray; SPRAY 2 SPRAYS INTO EACH NOSTRIL EVERY DAY  Dispense: 16 g; Refill: 2   Zachary Meyer 10/20/2017 10:37 AM

## 2018-01-12 ENCOUNTER — Other Ambulatory Visit: Payer: Self-pay

## 2018-01-12 DIAGNOSIS — K219 Gastro-esophageal reflux disease without esophagitis: Secondary | ICD-10-CM

## 2018-01-12 DIAGNOSIS — G250 Essential tremor: Secondary | ICD-10-CM

## 2018-01-12 NOTE — Telephone Encounter (Signed)
Refill request for general medication: Propranolol 20 mg  Last office visit: 10/20/2017  Last physical exam: None indicated  Follow-up on file. 01/21/2018

## 2018-01-13 MED ORDER — PROPRANOLOL HCL 20 MG PO TABS: 20.0000 mg | ORAL_TABLET | Freq: Two times a day (BID) | ORAL | 1 refills | Status: DC

## 2018-01-13 MED ORDER — OMEPRAZOLE 20 MG PO CPDR: DELAYED_RELEASE_CAPSULE | ORAL | 1 refills | Status: DC

## 2018-01-19 ENCOUNTER — Other Ambulatory Visit: Payer: Self-pay

## 2018-01-19 DIAGNOSIS — J310 Chronic rhinitis: Secondary | ICD-10-CM

## 2018-01-19 MED ORDER — FLUTICASONE PROPIONATE 50 MCG/ACT NA SUSP: NASAL | 2 refills | Status: DC

## 2018-01-19 NOTE — Telephone Encounter (Signed)
Refill request for general medication: Flonase Nasal Spray 50 mcg  Last office visit: 10/20/2017  Last physical exam: None indicated  Follow-up on file. 01/21/2018

## 2018-01-21 ENCOUNTER — Ambulatory Visit: Payer: Medicare Other | Admitting: Family Medicine

## 2018-01-21 ENCOUNTER — Encounter: Payer: Self-pay | Admitting: Family Medicine

## 2018-01-21 VITALS — BP 140/80 | HR 67 | Resp 14 | Ht 72.0 in | Wt 213.8 lb

## 2018-01-21 DIAGNOSIS — I10 Essential (primary) hypertension: Secondary | ICD-10-CM | POA: Diagnosis not present

## 2018-01-21 DIAGNOSIS — J310 Chronic rhinitis: Secondary | ICD-10-CM | POA: Diagnosis not present

## 2018-01-21 DIAGNOSIS — K219 Gastro-esophageal reflux disease without esophagitis: Secondary | ICD-10-CM | POA: Diagnosis not present

## 2018-01-21 DIAGNOSIS — Z23 Encounter for immunization: Secondary | ICD-10-CM

## 2018-01-21 DIAGNOSIS — E785 Hyperlipidemia, unspecified: Secondary | ICD-10-CM

## 2018-01-21 DIAGNOSIS — G250 Essential tremor: Secondary | ICD-10-CM | POA: Diagnosis not present

## 2018-01-21 MED ORDER — AMLODIPINE BESYLATE 5 MG PO TABS: 5.0000 mg | ORAL_TABLET | Freq: Every day | ORAL | 1 refills | Status: DC

## 2018-01-21 MED ORDER — ATORVASTATIN CALCIUM 40 MG PO TABS: 40.0000 mg | ORAL_TABLET | Freq: Every day | ORAL | 1 refills | Status: DC

## 2018-01-21 MED ORDER — LOSARTAN POTASSIUM-HCTZ 50-12.5 MG PO TABS: 1.0000 | ORAL_TABLET | Freq: Every day | ORAL | 1 refills | Status: DC

## 2018-01-21 MED ORDER — ASPIRIN EC 81 MG PO TBEC: 81.0000 mg | DELAYED_RELEASE_TABLET | Freq: Every day | ORAL | 0 refills | Status: AC

## 2018-01-21 NOTE — Patient Instructions (Signed)
Rotator Cuff Tear Rehab After Surgery Ask your health care provider which exercises are safe for you. Do exercises exactly as told by your health care provider and adjust them as directed. It is normal to feel mild stretching, pulling, tightness, or discomfort as you do these exercises, but you should stop right away if you feel sudden pain or your pain gets worse. Do not begin these exercises until told by your health care provider. Stretching and range of motion exercises These exercises warm up your muscles and joints and improve the movement and flexibility of your shoulder. These exercises also help to relieve pain, numbness, and tingling. Exercise A: Pendulum  1. Stand near a wall or a surface that you can hold onto for balance. 2. Bend at the waist and let your left / right arm hang straight down. Use your other arm to keep your balance. 3. Relax your arm and shoulder muscles, and move your hips and your trunk so your left / right arm swings freely. Your arm should swing because of the motion of your body, not because you are using your arm or shoulder muscles. 4. Keep moving so your arm swings in the following directions, as told by your health care provider: ? Side to side. ? Forward and backward. ? In clockwise and counterclockwise circles. Repeat __________ times, or for __________ seconds per direction. Complete this exercise __________ times a day. Exercise B: Flexion, seated  1. Sit in a stable chair so your left / right forearm can rest on a flat surface. Your elbow should rest at a height that keeps your upper arm next to your body. 2. Keeping your shoulder relaxed, lean forward at the waist and let your hand slide forward. Stop when you feel a stretch in your shoulder, or when you reach the angle that is recommended by your health care provider. 3. Hold for __________ seconds. 4. Slowly return to the starting position. Repeat __________ times. Complete this exercise __________  times a day. Exercise C: Flexion, standing  1. Stand and hold a broomstick, a cane, or a similar object. Place your hands a little more than shoulder-width apart on the object. Your left / right hand should be palm-up, and your other hand should be palm-down. 2. Push the stick down with your healthy arm to raise your left / right arm in front of your body, and then over your head. Use your other hand to help move the stick. Stop when you feel a stretch in your shoulder, or when you reach the angle that is recommended by your health care provider. ? Avoid shrugging your shoulder while you raise your arm. Keep your shoulder blade tucked down toward your spine. ? Keep your left / right shoulder muscles relaxed. 3. Hold for __________ seconds. 4. Slowly return to the starting position. Repeat __________ times. Complete this exercise __________ times a day. Exercise D: Abduction, supine  1. Lie on your back and hold a broomstick, a cane, or a similar object. Place your hands a little more than shoulder-width apart on the object. Your left / right hand should be palm-up, and your other hand should be palm-down. 2. Push the stick to raise your left / right arm out to your side and then over your head. Use your other hand to help move the stick. Stop when you feel a stretch in your shoulder, or when you reach the angle that is recommended by your health care provider. ? Avoid shrugging your shoulder while   you raise your arm. Keep your shoulder blade tucked down toward your spine. 3. Hold for __________ seconds. 4. Slowly return to the starting position. Repeat __________ times. Complete this exercise __________ times a day. Exercise E: Shoulder flexion, active-assisted  1. Lie on your back. You may bend your knees for comfort. 2. Hold a broomstick, a cane, or a similar object so your hands are about shoulder-width apart. Your palms should face toward your feet. 3. Raise your left / right arm over your  head and behind your head, toward the floor. Use your other hand to help you do this. Stop when you feel a gentle stretch in your shoulder, or when you reach the angle that is recommended by your health care provider. 4. Hold for __________ seconds. 5. Use the broomstick and your other arm to help you return your left / right arm to the starting position. Repeat __________ times. Complete this exercise __________ times a day. Exercise F: External rotation  1. Sit in a stable chair without armrests, or stand. 2. Tuck a soft object, such as a folded towel or a small ball, under your left / right upper arm. 3. Hold a broomstick, a cane, or a similar object so your palms face down, toward the floor. Bend your elbows to an "L" shape (90 degrees), and keep your hands about shoulder-width apart. 4. Straighten your healthy arm and push the broomstick across your body, toward your left / right side. Keep your left / right arm bent. This will rotate your left / right forearm away from your body. 5. Hold for __________ seconds. 6. Slowly return to the starting position. Repeat __________ times. Complete this exercise __________ times a day. Strengthening exercises These exercises build strength and endurance in your shoulder. Endurance is the ability to use your muscles for a long time, even after they get tired. Exercise G: Shoulder flexion, isometric  1. Stand or sit about 4-6 inches (10-15 cm) away from a wall with your left / right side facing the wall. 2. Gently make a fist and place your left / right hand on the wall so the top of your fist touches the wall. 3. With your left / right elbow straight, gently press the top of your fist into the wall. Gradually increase the pressure until you are pressing as hard as you can without shrugging your shoulder. 4. Hold for __________ seconds. 5. Slowly release the tension and relax your muscles completely before you repeat the exercise. Repeat __________  times. Complete this exercise __________ times a day. Exercise H: Shoulder abduction, isometric  1. Stand or sit about 4-6 inches (10-15 cm) away from a wall with your right/left side facing the wall. 2. Bend your left / right elbow and gently press your elbow into the wall as if you are trying to move your arm out to your side. Increase the pressure gradually until you are pressing as hard as you can without shrugging your shoulder. 3. Hold for __________ seconds. 4. Slowly release the tension and relax your muscles completely before repeating the exercise. Repeat __________ times. Complete this exercise __________ times a day. Exercise I: Internal rotation, isometric  1. Stand or sit in a doorway, facing the door frame. 2. Bend your left / right elbow and place the palm of your hand against the door frame. Only your palm should be touching the frame. Keep your upper arm at your side. 3. Gently press your hand into the door frame, as if   you are trying to push your arm toward your abdomen. Do not let your wrist bend. ? Avoid shrugging your shoulder while you press your hand into the door frame. Keep your shoulder blade tucked down toward the middle of your back. 4. Hold for __________ seconds. 5. Slowly release the tension, and relax your muscles completely before you repeat the exercise. Repeat __________ times. Complete this exercise __________ times a day. Exercise J: External rotation, isometric  1. Stand or sit in a doorway, facing the door frame. 2. Bend your left / right elbow and place the back of your wrist against the door frame. Only the back of your wrist should be touching the frame. Keep your upper arm at your side. 3. Gently press your wrist against the door frame, as if you are trying to push your arm away from your abdomen. ? Avoid shrugging your shoulder while you press your wrist into the door frame. Keep your shoulder blade tucked down toward the middle of your  back. 4. Hold for __________ seconds. 5. Slowly release the tension, and relax your muscles completely before you repeat the exercise. Repeat __________ times. Complete this exercise __________ times a day. This information is not intended to replace advice given to you by your health care provider. Make sure you discuss any questions you have with your health care provider. Document Released: 11/11/2005 Document Revised: 07/18/2016 Document Reviewed: 11/25/2015 [employer] Interactive Patient Education  2018 [employer]  

## 2018-01-21 NOTE — Progress Notes (Signed)
Name: Zachary Meyer   MRN: [id number]    DOB: 1941-03-08   Date:01/21/2018       Progress Note  Subjective  Chief Complaint  Chief Complaint  Patient presents with  . Hypertension  . Hyperlipidemia  . Hyperglycemia    HPI  HTN: he is compliant with his medications, he denies side effects. He takes ARB, HCTZ and low dose Norvasc. He is on Propanolol but was added by neurologist for essential tremors. He denies chest pain, palpitation or SOB  Dyslipidemia: he has low HDL, LDL still above goal, taking Lipitor 20 mg and we will increase to 40 mg today to get LDL below 100, he also has low HDL, discussed eating more fish and shellfish, tree nuts and exercise more. He will also resume taking aspirin 81 mg daily   Hyperglycemia: no polyphagia, polydipsia or polyuria. Last hgbA1C was normal last year.   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 He does not want to see Ortho  Patient Active Problem List   Diagnosis Date Noted  . Hyperglycemia 01/23/2016  . Hypertension 07/25/2015  . GERD (gastroesophageal reflux disease) 07/25/2015  . Hyperlipidemia 07/25/2015  . Perennial allergic rhinitis 07/25/2015  . Benign essential tremor 07/25/2015    Past Surgical History:  Procedure Laterality Date  . MELANOMA EXCISION Left 12/2012    Family History  Problem Relation Age of Onset  . Hypertension Sister   . Hypertension Brother   . Heart attack Mother     Social History   Socioeconomic History  . Marital status: Married    Spouse name: [name]   . Number of children: 2  . Years of education: Not on file  . Highest education level: 10th grade  Social Needs  . Financial resource strain: Not on file  . Food insecurity - worry: Not on file  . Food insecurity - inability: Not  on file  . Transportation needs - medical: Not on file  . Transportation needs - non-medical: Not on file  Occupational History  . Occupation: driver     Comment: road crew for [employer]   Tobacco Use  . Smoking status: Former Smoker    Packs/day: 0.50    Years: 10.00    Pack years: 5.00    Types: Cigarettes    Last attempt to quit: 01/22/1964    Years since quitting: 54.0  . Smokeless tobacco: Never Used  Substance and Sexual Activity  . Alcohol use: No    Alcohol/week: 0.0 oz  . Drug use: No  . Sexual activity: No  Other Topics Concern  . Not on file  Social History Narrative  . Not on file     Current Outpatient Medications:  .  amLODipine (NORVASC) 5 MG tablet, Take 1 tablet (5 mg total) by mouth daily., Disp: 90 tablet, Rfl: 1 .  atorvastatin (LIPITOR) 40 MG tablet, Take 1 tablet (40 mg total) by mouth daily at 6 PM., Disp: 90 tablet, Rfl: 1 .  fluticasone (FLONASE) 50 MCG/ACT nasal spray, SPRAY 2 SPRAYS INTO EACH NOSTRIL EVERY DAY, Disp: 16 g, Rfl: 2 .  losartan-hydrochlorothiazide (HYZAAR) 50-12.5 MG tablet, Take 1 tablet by mouth daily., Disp: 90 tablet, Rfl: 1 .  omeprazole (PRILOSEC) 20 MG capsule, TAKE 1 CAPSULE BY MOUTH  EVERY DAY, Disp: 90 capsule, Rfl: 1 .  propranolol (INDERAL) 20 MG tablet, Take 1 tablet (20 mg total) by mouth 2 (two) times daily., Disp: 180 tablet, Rfl: 1 .  aspirin EC 81 MG tablet, Take 1 tablet (81 mg total) by mouth daily., Disp: 30 tablet, Rfl: 0  No Known Allergies   ROS  Constitutional: Negative for fever or weight change.  Respiratory: Negative for cough and shortness of breath.   Cardiovascular: Negative for chest pain or palpitations.  Gastrointestinal: Negative for abdominal pain, no bowel changes.  Musculoskeletal: Negative for gait problem or joint swelling.  Skin: Negative for rash.  Neurological: Negative for dizziness or headache.  No other specific complaints in a complete review of systems (except as  listed in HPI above).  Objective  Vitals:   01/21/18 1003  BP: 140/80  Pulse: 67  Resp: 14  SpO2: 97%  Weight: 213 lb 12.8 oz (97 kg)  Height: 6' (1.829 m)    Body mass index is 29 kg/m.  Physical Exam  Constitutional: Patient appears well-developed and well-nourished. Overweight  No distress.  HEENT: head atraumatic, normocephalic, pupils equal and reactive to light, neck supple, throat within normal limits Cardiovascular: Normal rate, regular rhythm and normal heart sounds.  No murmur heard. No BLE edema. Pulmonary/Chest: Effort normal and breath sounds normal. No respiratory distress. Abdominal: Soft.  There is no tenderness. Psychiatric: Patient has a normal mood and affect. behavior is normal. Judgment and thought content normal. Muscular Skeletal: pain with abduction of right shoulder normal internal rotation, positive empty can sign  PHQ2/9: Depression screen [facility] 2/9 10/20/2017 09/19/2017 08/05/2017 07/18/2017 04/18/2017  Decreased Interest 0 0 0 0 0  Down, Depressed, Hopeless 0 0 0 0 0  PHQ - 2 Score 0 0 0 0 0     Fall Risk: Fall Risk  01/21/2018 10/20/2017 09/19/2017 08/05/2017 07/18/2017  Falls in the past year? No Yes Yes Yes No  Number falls in past yr: - 1 1 1  -  Injury with Fall? - No Yes No -  Comment - - soft tissue injury to R shoulder - -  Follow up - Falls evaluation completed Education provided;Falls prevention discussed - -      Functional Status Survey: Is the patient deaf or have difficulty hearing?: No Does the patient have difficulty seeing, even when wearing glasses/contacts?: No Does the patient have difficulty concentrating, remembering, or making decisions?: No Does the patient have difficulty walking or climbing stairs?: No Does the patient have difficulty dressing or bathing?: No Does the patient have difficulty doing errands alone such as visiting a doctor's office or shopping?: No    Assessment & Plan  1. Essential hypertension  -  aspirin EC 81 MG tablet; Take 1 tablet (81 mg total) by mouth daily.  Dispense: 30 tablet; Refill: 0 - amLODipine (NORVASC) 5 MG tablet; Take 1 tablet (5 mg total) by mouth daily.  Dispense: 90 tablet; Refill: 1 - losartan-hydrochlorothiazide (HYZAAR) 50-12.5 MG tablet; Take 1 tablet by mouth daily.  Dispense: 90 tablet; Refill: 1 - EKG 12-Lead  2. Dyslipidemia (high LDL; low HDL)  - atorvastatin (LIPITOR) 40 MG tablet; Take 1 tablet (40 mg total) by mouth daily at 6 PM.  Dispense: 90 tablet; Refill: 1  3. Gastroesophageal reflux disease, esophagitis presence not specified  Discussed long term risk of PPI use, he will try to wean self off.   4. Chronic rhinitis  Stable with medication   5. Need for pneumococcal vaccination  -  Pneumococcal conjugate vaccine 13-valent  6. Essential tremor  On Propanolol

## 2018-02-23 ENCOUNTER — Other Ambulatory Visit: Payer: Self-pay

## 2018-02-23 MED ORDER — TELMISARTAN-HCTZ 40-12.5 MG PO TABS: 1.0000 | ORAL_TABLET | Freq: Every day | ORAL | 0 refills | Status: DC

## 2018-02-23 NOTE — Telephone Encounter (Signed)
[facility] expressed large recall volume of Losartan/HCTZ medications and alternative therapies for patients.

## 2018-04-13 ENCOUNTER — Other Ambulatory Visit: Payer: Self-pay | Admitting: Family Medicine

## 2018-04-13 DIAGNOSIS — J310 Chronic rhinitis: Secondary | ICD-10-CM

## 2018-07-05 ENCOUNTER — Other Ambulatory Visit: Payer: Self-pay | Admitting: Family Medicine

## 2018-07-05 DIAGNOSIS — K219 Gastro-esophageal reflux disease without esophagitis: Secondary | ICD-10-CM

## 2018-07-05 DIAGNOSIS — G250 Essential tremor: Secondary | ICD-10-CM

## 2018-07-10 ENCOUNTER — Other Ambulatory Visit: Payer: Self-pay | Admitting: Family Medicine

## 2018-07-10 DIAGNOSIS — J310 Chronic rhinitis: Secondary | ICD-10-CM

## 2018-07-12 ENCOUNTER — Other Ambulatory Visit: Payer: Self-pay | Admitting: Family Medicine

## 2018-07-12 DIAGNOSIS — E785 Hyperlipidemia, unspecified: Secondary | ICD-10-CM

## 2018-07-12 DIAGNOSIS — I10 Essential (primary) hypertension: Secondary | ICD-10-CM

## 2018-07-21 ENCOUNTER — Encounter: Payer: Self-pay | Admitting: Family Medicine

## 2018-07-21 ENCOUNTER — Ambulatory Visit: Payer: Medicare Other | Admitting: Family Medicine

## 2018-07-21 VITALS — BP 128/68 | HR 83 | Temp 98.1°F | Resp 16 | Ht 72.0 in | Wt 212.5 lb

## 2018-07-21 DIAGNOSIS — J310 Chronic rhinitis: Secondary | ICD-10-CM

## 2018-07-21 DIAGNOSIS — D692 Other nonthrombocytopenic purpura: Secondary | ICD-10-CM

## 2018-07-21 DIAGNOSIS — N183 Chronic kidney disease, stage 3 unspecified: Secondary | ICD-10-CM

## 2018-07-21 DIAGNOSIS — I10 Essential (primary) hypertension: Secondary | ICD-10-CM | POA: Diagnosis not present

## 2018-07-21 DIAGNOSIS — E785 Hyperlipidemia, unspecified: Secondary | ICD-10-CM

## 2018-07-21 DIAGNOSIS — G250 Essential tremor: Secondary | ICD-10-CM

## 2018-07-21 DIAGNOSIS — R739 Hyperglycemia, unspecified: Secondary | ICD-10-CM

## 2018-07-21 DIAGNOSIS — E559 Vitamin D deficiency, unspecified: Secondary | ICD-10-CM

## 2018-07-21 MED ORDER — LOSARTAN POTASSIUM-HCTZ 50-12.5 MG PO TABS: 1.0000 | ORAL_TABLET | Freq: Every day | ORAL | 1 refills | Status: DC

## 2018-07-21 MED ORDER — ATORVASTATIN CALCIUM 40 MG PO TABS: 40.0000 mg | ORAL_TABLET | Freq: Every day | ORAL | 1 refills | Status: DC

## 2018-07-21 MED ORDER — AMLODIPINE BESYLATE 5 MG PO TABS: 5.0000 mg | ORAL_TABLET | Freq: Every day | ORAL | 1 refills | Status: DC

## 2018-07-21 MED ORDER — FLUTICASONE PROPIONATE 50 MCG/ACT NA SUSP: NASAL | 1 refills | Status: DC

## 2018-07-21 NOTE — Patient Instructions (Signed)
Call insurance and find out about [employer] coverage  Wait until next visit for flu vaccine - high dose Come in for Tda[ - tetanus booster if you get a scratch or a cut

## 2018-07-21 NOTE — Progress Notes (Signed)
Name: Zachary Meyer   MRN: [id number]    DOB: 1941-03-30   Date:07/21/2018       Progress Note  Subjective  Chief Complaint  Chief Complaint  Patient presents with  . Medication Refill    6 month F/U  . Hypertension    Denies any symptoms  . Dyslipidemia  . Hyperglycemia    HPI  HTN: he is compliant with his medications, he occasionally has dizziness when working his yard and gets up from stooping position. Discussed staying hydrated and to use a stools instead  He takes ARB, HCTZ and low dose Norvasc. He is on Propanolol but was added by neurologist for essential tremors. He denies chest pain, palpitation or SOB  Dyslipidemia: he has low HDL, LDL still above goal, taking Lipitor 20 mg and we increased dose to 40 mg on his last visit, tolerating medication well, we will recheck labs today  CKI stage III: based on low GFR for past 2 years, we will recheck labs, avoid nsaid's, and drink plenty of water, take tylenol prn for pain.   Hyperglycemia: no polyphagia, polydipsia or polyuria. We will recheck labs. He states he will try to cut down on cookies.   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 Doing better with home exercise  Tremors: chronic, seen by neurologist, aggravated when he uses weed eater, otherwise stable, taking propanolol bid   Senile purpura: he has easy bruising and thin skin, gave reassurance.   AR: using nasal steroids at night when congestion is worse and is doing well on medication   Patient Active Problem List   Diagnosis Date Noted  . Senile purpura ([place]) 07/21/2018  . Hyperglycemia 01/23/2016  . Hypertension 07/25/2015  . GERD (gastroesophageal reflux disease) 07/25/2015  . Hyperlipidemia 07/25/2015  . Perennial allergic rhinitis 07/25/2015   . Benign essential tremor 07/25/2015    Past Surgical History:  Procedure Laterality Date  . MELANOMA EXCISION Left 12/2012    Family History  Problem Relation Age of Onset  . Hypertension Sister   . Hypertension Brother   . Heart attack Mother     Social History   Socioeconomic History  . Marital status: Married    Spouse name: [name]   . Number of children: 2  . Years of education: Not on file  . Highest education level: 10th grade  Occupational History  . 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 Financial resource strain: Not hard at all  . Food insecurity:    Worry: Never true    Inability: Never true  . Transportation needs:    Medical: No    Non-medical: No  Tobacco Use  . Smoking status: Former Smoker    Packs/day: 0.50    Years: 10.00    Pack years: 5.00    Types: Cigarettes    Start date: 11/25/1953    Last attempt to quit: 01/22/1964    Years since quitting: 54.5  . Smokeless tobacco: Never Used  Substance and Sexual Activity  . Alcohol use: No    Alcohol/week: 0.0 standard drinks  . Drug use: No  . Sexual activity: Never  Lifestyle  . Physical activity:    Days per week: 3 days  Minutes per session: 80 min  . Stress: Not at all  Relationships  . Social connections:    Talks on phone: Three times a week    Gets together: More than three times a week    Attends religious service: More than 4 times per year    Active member of club or organization: Yes    Attends meetings of clubs or organizations: More than 4 times per year    Relationship status: Married  . Intimate partner violence:    Fear of current or ex partner: No    Emotionally abused: No    Physically abused: No    Forced sexual activity: No  Other Topics Concern  . Not on file  Social History Narrative  . Not on file     Current Outpatient Medications:  .  amLODipine (NORVASC) 5 MG tablet, Take 1 tablet (5 mg total) by  mouth daily., Disp: 90 tablet, Rfl: 1 .  aspirin EC 81 MG tablet, Take 1 tablet (81 mg total) by mouth daily., Disp: 30 tablet, Rfl: 0 .  atorvastatin (LIPITOR) 40 MG tablet, TAKE 1 TABLET (40 MG TOTAL) BY MOUTH DAILY AT 6 PM., Disp: 90 tablet, Rfl: 0 .  fluticasone (FLONASE) 50 MCG/ACT nasal spray, SPRAY 2 SPRAYS INTO EACH NOSTRIL EVERY DAY, Disp: 16 g, Rfl: 0 .  losartan-hydrochlorothiazide (HYZAAR) 50-12.5 MG tablet, TAKE 1 TABLET BY MOUTH EVERY DAY, Disp: 90 tablet, Rfl: 0 .  omeprazole (PRILOSEC) 20 MG capsule, TAKE 1 CAPSULE BY MOUTH EVERY DAY, Disp: 90 capsule, Rfl: 1 .  propranolol (INDERAL) 20 MG tablet, TAKE 1 TABLET BY MOUTH TWICE A DAY, Disp: 180 tablet, Rfl: 1 .  telmisartan-hydrochlorothiazide (MICARDIS HCT) 40-12.5 MG tablet, Take 1 tablet by mouth daily., Disp: 90 tablet, Rfl: 0  No Known Allergies   ROS  Constitutional: Negative for fever or weight change.  Respiratory: Negative for cough and shortness of breath.   Cardiovascular: Negative for chest pain or palpitations.  Gastrointestinal: Negative for abdominal pain, no bowel changes.  Musculoskeletal: Negative for gait problem or joint swelling.  Skin: Negative for rash.  Neurological: positive for mild dizziness when getting up when doing yard work,  headache.  No other specific complaints in a complete review of systems (except as listed in HPI above).   Objective  Vitals:   07/21/18 1008  BP: 128/68  Pulse: 83  Resp: 16  Temp: 98.1 F (36.7 C)  TempSrc: Oral  SpO2: 96%  Weight: 212 lb 8 oz (96.4 kg)  Height: 6' (1.829 m)    Body mass index is 28.82 kg/m.  Physical Exam  Constitutional: Patient appears well-developed and well-nourished. Overweight. No distress.  HEENT: head atraumatic, normocephalic, pupils equal and reactive to light,  neck supple, throat within normal limits Cardiovascular: Normal rate, regular rhythm and normal heart sounds.  No murmur heard. No BLE edema. Pulmonary/Chest: Effort  normal and breath sounds normal. No respiratory distress. Abdominal: Soft.  There is no tenderness. Skin: senile purpura Psychiatric: Patient has a normal mood and affect. behavior is normal. Judgment and thought content normal.  PHQ2/9: Depression screen [facility] 2/9 07/21/2018 10/20/2017 09/19/2017 08/05/2017 07/18/2017  Decreased Interest 0 0 0 0 0  Down, Depressed, Hopeless 0 0 0 0 0  PHQ - 2 Score 0 0 0 0 0     Fall Risk: Fall Risk  07/21/2018 01/21/2018 10/20/2017 09/19/2017 08/05/2017  Falls in the past year? No No Yes Yes Yes  Number falls in past yr: - -  1 1 1   Injury with Fall? - - No Yes No  Comment - - - soft tissue injury to R shoulder -  Follow up - - Falls evaluation completed Education provided;Falls prevention discussed -     Functional Status Survey: Is the patient deaf or have difficulty hearing?: No Does the patient have difficulty seeing, even when wearing glasses/contacts?: Yes(glasses) Does the patient have difficulty concentrating, remembering, or making decisions?: No Does the patient have difficulty walking or climbing stairs?: No Does the patient have difficulty dressing or bathing?: No Does the patient have difficulty doing errands alone such as visiting a doctor's office or shopping?: No    Assessment & Plan   1. Essential hypertension  - amLODipine (NORVASC) 5 MG tablet; Take 1 tablet (5 mg total) by mouth daily.  Dispense: 90 tablet; Refill: 1 - losartan-hydrochlorothiazide (HYZAAR) 50-12.5 MG tablet; Take 1 tablet by mouth daily.  Dispense: 90 tablet; Refill: 1  2. Dyslipidemia (high LDL; low HDL)  - atorvastatin (LIPITOR) 40 MG tablet; Take 1 tablet (40 mg total) by mouth daily at 6 PM.  Dispense: 90 tablet; Refill: 1  3. Essential tremor  Stable on propanolol   4. Senile purpura ([place])  Gave reassurance   5. Chronic rhinitis  - fluticasone (FLONASE) 50 MCG/ACT nasal spray; 2 sprays q nostril qhs  Dispense: 48 g; Refill: 1  6.  Hyperglycemia  - Hemoglobin A1c  7. Vitamin D deficiency  - VITAMIN D 25 Hydroxy (Vit-D Deficiency, Fractures)  8. Chronic kidney disease, stage III (moderate) (HCC)  - COMPLETE METABOLIC PANEL WITH GFR - CBC with Differential/Platelet - VITAMIN D 25 Hydroxy (Vit-D Deficiency, Fractures)

## 2018-07-22 LAB — CBC WITH DIFFERENTIAL/PLATELET
Basophils Absolute: 30 cells/uL (ref 0–200)
Basophils Relative: 0.4 %
EOS ABS: 278 {cells}/uL (ref 15–500)
Eosinophils Relative: 3.7 %
HCT: 43.6 % (ref 38.5–50.0)
HEMOGLOBIN: 14.9 g/dL (ref 13.2–17.1)
Lymphs Abs: 1298 cells/uL (ref 850–3900)
MCH: 31.2 pg (ref 27.0–33.0)
MCHC: 34.2 g/dL (ref 32.0–36.0)
MCV: 91.4 fL (ref 80.0–100.0)
MONOS PCT: 7.9 %
MPV: 10.2 fL (ref 7.5–12.5)
Neutro Abs: 5303 cells/uL (ref 1500–7800)
Neutrophils Relative %: 70.7 %
PLATELETS: 271 10*3/uL (ref 140–400)
RBC: 4.77 10*6/uL (ref 4.20–5.80)
RDW: 11.7 % (ref 11.0–15.0)
TOTAL LYMPHOCYTE: 17.3 %
WBC mixed population: 593 cells/uL (ref 200–950)
WBC: 7.5 10*3/uL (ref 3.8–10.8)

## 2018-07-22 LAB — COMPLETE METABOLIC PANEL WITH GFR
AG RATIO: 1.8 (calc) (ref 1.0–2.5)
ALBUMIN MSPROF: 4.6 g/dL (ref 3.6–5.1)
ALT: 17 U/L (ref 9–46)
AST: 15 U/L (ref 10–35)
Alkaline phosphatase (APISO): 97 U/L (ref 40–115)
BUN/Creatinine Ratio: 12 (calc) (ref 6–22)
BUN: 17 mg/dL (ref 7–25)
CALCIUM: 9.9 mg/dL (ref 8.6–10.3)
CO2: 29 mmol/L (ref 20–32)
Chloride: 102 mmol/L (ref 98–110)
Creat: 1.38 mg/dL — ABNORMAL HIGH (ref 0.70–1.18)
GFR, EST NON AFRICAN AMERICAN: 50 mL/min/{1.73_m2} — AB (ref >=60)
GFR, Est African American: 58 mL/min/{1.73_m2} — ABNORMAL LOW (ref >=60)
GLOBULIN: 2.6 g/dL (ref 1.9–3.7)
Glucose, Bld: 99 mg/dL (ref 65–99)
POTASSIUM: 4.4 mmol/L (ref 3.5–5.3)
SODIUM: 140 mmol/L (ref 135–146)
TOTAL PROTEIN: 7.2 g/dL (ref 6.1–8.1)
Total Bilirubin: 0.7 mg/dL (ref 0.2–1.2)

## 2018-07-22 LAB — LIPID PANEL
CHOL/HDL RATIO: 4.7 (calc) (ref <5.0)
CHOLESTEROL: 155 mg/dL (ref <200)
HDL: 33 mg/dL — ABNORMAL LOW (ref >40)
LDL CHOLESTEROL (CALC): 101 mg/dL — AB
Non-HDL Cholesterol (Calc): 122 mg/dL (calc) (ref <130)
Triglycerides: 117 mg/dL (ref <150)

## 2018-07-22 LAB — HEMOGLOBIN A1C
EAG (MMOL/L): 6.8 (calc)
HEMOGLOBIN A1C: 5.9 %{Hb} — AB (ref <5.7)
MEAN PLASMA GLUCOSE: 123 (calc)

## 2018-07-22 LAB — VITAMIN D 25 HYDROXY (VIT D DEFICIENCY, FRACTURES): Vit D, 25-Hydroxy: 25 ng/mL — ABNORMAL LOW (ref 30–100)

## 2018-09-22 ENCOUNTER — Encounter: Payer: Self-pay | Admitting: Family Medicine

## 2018-09-22 ENCOUNTER — Ambulatory Visit (INDEPENDENT_AMBULATORY_CARE_PROVIDER_SITE_OTHER): Payer: Medicare Other | Admitting: Family Medicine

## 2018-09-22 ENCOUNTER — Ambulatory Visit: Payer: Medicare Other

## 2018-09-22 ENCOUNTER — Telehealth: Payer: Self-pay | Admitting: Family Medicine

## 2018-09-22 VITALS — BP 130/70 | HR 96 | Temp 98.0°F | Resp 16 | Ht 72.0 in | Wt 206.9 lb

## 2018-09-22 DIAGNOSIS — Z0000 Encounter for general adult medical examination without abnormal findings: Secondary | ICD-10-CM | POA: Diagnosis not present

## 2018-09-22 DIAGNOSIS — Z23 Encounter for immunization: Secondary | ICD-10-CM

## 2018-09-22 DIAGNOSIS — Z136 Encounter for screening for cardiovascular disorders: Secondary | ICD-10-CM | POA: Diagnosis not present

## 2018-09-22 DIAGNOSIS — Z1211 Encounter for screening for malignant neoplasm of colon: Secondary | ICD-10-CM | POA: Diagnosis not present

## 2018-09-22 DIAGNOSIS — Z1212 Encounter for screening for malignant neoplasm of rectum: Secondary | ICD-10-CM

## 2018-09-22 NOTE — Telephone Encounter (Signed)
Please abstract Hep C screening from old system. Thanks!

## 2018-09-22 NOTE — Telephone Encounter (Signed)
Can you check and see if you can check old [place] chart for his results

## 2018-09-22 NOTE — Patient Instructions (Addendum)
Please bring your living will in to our office at your next appointment.  **Please call to schedule your abdominal ultrasound test at [phone].  You must call to schedule, they will not call you.**   Zachary Meyer , Thank you for taking time to come for your Medicare Wellness Visit. I appreciate your ongoing commitment to your health goals. Please review the following plan we discussed and let me know if I can assist you in the future.   These are the goals we discussed: Goals    . Reduce portion size     Recommend to reduce portion size of meals to 3 small healthy meals per day and 2 healthy snacks per day        This is a list of the screening recommended for you and due dates:  Health Maintenance  Topic Date Due  . Tetanus Vaccine  11/05/1962  . Colon Cancer Screening  11/29/2016  . Flu Shot  06/25/2018  . Pneumonia vaccines  Completed

## 2018-09-22 NOTE — Progress Notes (Signed)
Patient: Zachary Meyer, Male    DOB: Apr 03, 1941, 77 y.o.   MRN: [id number]  Visit Date: 09/22/2018  Today's Provider: Hubbard Hartshorn, FNP   Chief Complaint  Patient presents with  . Annual Exam    Medicare Wellness    Subjective:   Zachary Meyer is a 77 y.o. male who presents today for his Subsequent Annual Wellness Visit.  Patient/Caregiver input:  No concerns today  HPI  Past Medical History:  Diagnosis Date  . GERD (gastroesophageal reflux disease)   . Hyperlipidemia   . Hypertension     Past Surgical History:  Procedure Laterality Date  . MELANOMA EXCISION Left 12/2012    Family History  Problem Relation Age of Onset  . Hypertension Sister   . Hypertension Brother   . Heart attack Mother     Social History   Socioeconomic History  . Marital status: Married    Spouse name: Zachary Meyer   . Number of children: 2  . Years of education: Not on file  . Highest education level: 10th grade  Occupational History  . 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 Financial resource strain: Not hard at all  . Food insecurity:    Worry: Never true    Inability: Never true  . Transportation needs:    Medical: No    Non-medical: No  Tobacco Use  . Smoking status: Former Smoker    Packs/day: 0.50    Years: 10.00    Pack years: 5.00    Types: Cigarettes    Start date: 11/25/1953    Last attempt to quit: 01/22/1964    Years since quitting: 54.7  . Smokeless tobacco: Never Used  Substance and Sexual Activity  . Alcohol use: No    Alcohol/week: 0.0 standard drinks  . Drug use: No  . Sexual activity: Never  Lifestyle  . Physical activity:    Days per week: 3 days    Minutes per session: 30 min  . Stress: Not at all  Relationships  . Social connections:    Talks on phone: Three times a week    Gets together: More than three times a week    Attends religious service: More than 4 times per year    Active member of  club or organization: Yes    Attends meetings of clubs or organizations: More than 4 times per year    Relationship status: Married  . Intimate partner violence:    Fear of current or ex partner: No    Emotionally abused: No    Physically abused: No    Forced sexual activity: No  Other Topics Concern  . Not on file  Social History Narrative  . Not on file    Outpatient Encounter Medications as of 09/22/2018  Medication Sig  . amLODipine (NORVASC) 5 MG tablet Take 1 tablet (5 mg total) by mouth daily.  Marland Kitchen aspirin EC 81 MG tablet Take 1 tablet (81 mg total) by mouth daily.  Marland Kitchen atorvastatin (LIPITOR) 40 MG tablet Take 1 tablet (40 mg total) by mouth daily at 6 PM.  . fluticasone (FLONASE) 50 MCG/ACT nasal spray 2 sprays q nostril qhs  . losartan-hydrochlorothiazide (HYZAAR) 50-12.5 MG tablet Take 1 tablet by mouth daily.  Marland Kitchen omeprazole (PRILOSEC) 20 MG capsule TAKE 1 CAPSULE BY MOUTH EVERY DAY  . propranolol (INDERAL) 20 MG tablet TAKE 1 TABLET BY MOUTH TWICE A DAY   No  facility-administered encounter medications on file as of 09/22/2018.     No Known Allergies  Care Team Updated in EHR: Yes  Last Vision Exam: 07/2018 Wears corrective lenses: Bifocals for reading Last Dental Exam: 07/2018 Last Hearing Exam: no - has had one in the past, but it has been a while; declines referral today Wears Hearing Aids: No  Functional Ability / Safety Screening 1.  Was the timed Get Up and Go test longer than 30 seconds?  no 2.  Does the patient need help with the phone, transportation, shopping,      preparing meals, housework, laundry, medications, or managing money?  no 3.  Does the patient's home have:  loose throw rugs in the hallway?   yes      Grab bars in the bathroom? no      Handrails on the stairs?   yes      Poor lighting?   no 4.  Has the patient noticed any hearing difficulties?   no  Diet Recall and Exercise Regimen: no  Fall Risk: See screening under Objective  Information Depression Screen: See screening under Objective Information  Advanced Directives: A voluntary discussion about advance care planning including the explanation and discussion of advance directives was discussed with the patient. Explanation about the health care proxy and living will was reviewed.  During this discussion, the patient was able to identify a health care proxy as Wife - Zachary Meyer and has a living will - will bring this to our office to keep on file. Does patient have a HCPOA?    yes If yes, name and contact information: [name], Wife Does patient have a living will or MOST form?  yes  Cancer Screenings: Lung: Quit 40-50 years ago - smoked as teenager.  Low Dose CT Chest recommended if Age 10-80 years, 30 pack-year currently smoking OR have quit w/in 15years. Patient does not qualify.  Lifestyle risk factor issued reviewed: Diet, exercise, weight management, advised patient smoking is not healthy, nutrition/diet.    Prostate: Score of 1 - has never had issues with his prostate, last PSA was back in 2017 and was normal.  Not indicated for recheck today IPSS Questionnaire (AUA-7): Over the past month.   1)  How often have you had a sensation of not emptying your bladder completely after you finish urinating?  0 - Not at all  2)  How often have you had to urinate again less than two hours after you finished urinating? 0 - Not at all  3)  How often have you found you stopped and started again several times when you urinated?  0 - Not at all  4) How difficult have you found it to postpone urination?  0 - Not at all  5) How often have you had a weak urinary stream?  0 - Not at all  6) How often have you had to push or strain to begin urination?  0 - Not at all  7) How many times did you most typically get up to urinate from the time you went to bed until the time you got up in the morning?  1 - 1 time  Total score:  0-7 mildly symptomatic   8-19 moderately symptomatic    20-35 severely symptomatic   Colorectal: 2015 - due now for repeat follow up (was told 3 year follow up) - will refer today.  Additional Screenings: Hepatitis B/HIV/Syphillis: Declines today Hepatitis C Screening: Cannot find record, however he thinks  he has had it done in the past, will try to abstract this info from old computer system AAA Screen: Men age 32 to 61 years if ever smoked recommended to get a one time AAA ultrasound screening exam. Patient does qualify.  Objective:   Vitals: BP 130/70 (BP Location: Right Arm, Patient Position: Sitting, Cuff Size: Large)   Pulse 96   Temp 98 F (36.7 C) (Oral)   Resp 16   Ht 6' (1.829 m)   Wt 206 lb 14.4 oz (93.8 kg)   SpO2 99%   BMI 28.06 kg/m  Body mass index is 28.06 kg/m.  Lab Results  Component Value Date   CHOL 155 07/21/2018   CHOL 171 10/20/2017   CHOL 156 04/18/2017   Lab Results  Component Value Date   HDL 33 (L) 07/21/2018   HDL 35 (L) 10/20/2017   HDL 32 (L) 04/18/2017   Lab Results  Component Value Date   LDLCALC 101 (H) 07/21/2018   LDLCALC 114 (H) 10/20/2017   LDLCALC 99 04/18/2017   Lab Results  Component Value Date   TRIG 117 07/21/2018   TRIG 117 10/20/2017   TRIG 125 04/18/2017   Lab Results  Component Value Date   CHOLHDL 4.7 07/21/2018   CHOLHDL 4.9 10/20/2017   CHOLHDL 4.9 04/18/2017   No results found for: LDLDIRECT  No exam data present  Cognitive Testing - 6-CIT -  6CIT Screen 09/22/2018 09/19/2017  What Year? 0 points 0 points  What month? 0 points -  What time? 0 points 0 points  Count back from 20 0 points 0 points  Months in reverse 0 points 0 points  Repeat phrase 2 points 4 points  Total Score 2 -   Fall Risk: Fall Risk  09/22/2018 07/21/2018 01/21/2018 10/20/2017 09/19/2017  Falls in the past year? No No No Yes Yes  Number falls in past yr: - - - 1 1  Injury with Fall? - - - No Yes  Comment - - - - soft tissue injury to R shoulder  Follow up - - - Falls evaluation  completed Education provided;Falls prevention discussed   Depression Screen Depression screen [facility] 2/9 09/22/2018 07/21/2018 10/20/2017 09/19/2017 08/05/2017  Decreased Interest 0 0 0 0 0  Down, Depressed, Hopeless 0 0 0 0 0  PHQ - 2 Score 0 0 0 0 0  Altered sleeping 0 - - - -  Tired, decreased energy 0 - - - -  Change in appetite 0 - - - -  Feeling bad or failure about yourself  0 - - - -  Trouble concentrating 0 - - - -  Moving slowly or fidgety/restless 0 - - - -  Suicidal thoughts 0 - - - -  PHQ-9 Score 0 - - - -  Difficult doing work/chores Not difficult at all - - - -    Recent Results (from the past 2160 hour(s))  COMPLETE METABOLIC PANEL WITH GFR     Status: Abnormal   Collection Time: 07/21/18 11:41 AM  Result Value Ref Range   Glucose, Bld 99 65 - 99 mg/dL    Comment: .            Fasting reference interval .    BUN 17 7 - 25 mg/dL   Creat 1.38 (H) 0.70 - 1.18 mg/dL    Comment: For patients >41 years of age, the reference limit for Creatinine is approximately 13% higher for people identified as African-American. Marland Kitchen  GFR, Est Non African American 50 (L) > OR = 60 mL/min/1.26m2   GFR, Est African American 58 (L) > OR = 60 mL/min/1.18m2   BUN/Creatinine Ratio 12 6 - 22 (calc)   Sodium 140 135 - 146 mmol/L   Potassium 4.4 3.5 - 5.3 mmol/L   Chloride 102 98 - 110 mmol/L   CO2 29 20 - 32 mmol/L   Calcium 9.9 8.6 - 10.3 mg/dL   Total Protein 7.2 6.1 - 8.1 g/dL   Albumin 4.6 3.6 - 5.1 g/dL   Globulin 2.6 1.9 - 3.7 g/dL (calc)   AG Ratio 1.8 1.0 - 2.5 (calc)   Total Bilirubin 0.7 0.2 - 1.2 mg/dL   Alkaline phosphatase (APISO) 97 40 - 115 U/L   AST 15 10 - 35 U/L   ALT 17 9 - 46 U/L  CBC with Differential/Platelet     Status: None   Collection Time: 07/21/18 11:41 AM  Result Value Ref Range   WBC 7.5 3.8 - 10.8 Thousand/uL   RBC 4.77 4.20 - 5.80 Million/uL   Hemoglobin 14.9 13.2 - 17.1 g/dL   HCT 43.6 38.5 - 50.0 %   MCV 91.4 80.0 - 100.0 fL   MCH 31.2 27.0 -  33.0 pg   MCHC 34.2 32.0 - 36.0 g/dL   RDW 11.7 11.0 - 15.0 %   Platelets 271 140 - 400 Thousand/uL   MPV 10.2 7.5 - 12.5 fL   Neutro Abs 5,303 1,500 - 7,800 cells/uL   Lymphs Abs 1,298 850 - 3,900 cells/uL   WBC mixed population 593 200 - 950 cells/uL   Eosinophils Absolute 278 15 - 500 cells/uL   Basophils Absolute 30 0 - 200 cells/uL   Neutrophils Relative % 70.7 %   Total Lymphocyte 17.3 %   Monocytes Relative 7.9 %   Eosinophils Relative 3.7 %   Basophils Relative 0.4 %  VITAMIN D 25 Hydroxy (Vit-D Deficiency, Fractures)     Status: Abnormal   Collection Time: 07/21/18 11:41 AM  Result Value Ref Range   Vit D, 25-Hydroxy 25 (L) 30 - 100 ng/mL    Comment: Vitamin D Status         25-OH Vitamin D: . Deficiency:                    <20 ng/mL Insufficiency:             20 - 29 ng/mL Optimal:                 > or = 30 ng/mL . For 25-OH Vitamin D testing on patients on  D2-supplementation and patients for whom quantitation  of D2 and D3 fractions is required, the QuestAssureD(TM) 25-OH VIT D, (D2,D3), LC/MS/MS is recommended: order  code [phone] (patients >86yrs). . For more information on this test, go to: http://education.questdiagnostics.com/faq/FAQ163 (This link is being provided for  informational/educational purposes only.)   Hemoglobin A1c     Status: Abnormal   Collection Time: 07/21/18 11:41 AM  Result Value Ref Range   Hgb A1c MFr Bld 5.9 (H) <5.7 % of total Hgb    Comment: For someone without known diabetes, a hemoglobin  A1c value between 5.7% and 6.4% is consistent with prediabetes and should be confirmed with a  follow-up test. . For someone with known diabetes, a value <7% indicates that their diabetes is well controlled. A1c targets should be individualized based on duration of diabetes, age, comorbid conditions, and other considerations. . This  assay result is consistent with an increased risk of diabetes. . Currently, no consensus exists regarding  use of hemoglobin A1c for diagnosis of diabetes for children. .    Mean Plasma Glucose 123 (calc)   eAG (mmol/L) 6.8 (calc)  Lipid panel     Status: Abnormal   Collection Time: 07/21/18 11:41 AM  Result Value Ref Range   Cholesterol 155 <200 mg/dL   HDL 33 (L) >40 mg/dL   Triglycerides 117 <150 mg/dL   LDL Cholesterol (Calc) 101 (H) mg/dL (calc)    Comment: Reference range: <100 . Desirable range <100 mg/dL for primary prevention;   <70 mg/dL for patients with CHD or diabetic patients  with > or = 2 CHD risk factors. Marland Kitchen LDL-C is now calculated using the Martin-Hopkins  calculation, which is a validated novel method providing  better accuracy than the Friedewald equation in the  estimation of LDL-C.  Cresenciano Genre et al. Annamaria Helling. 6644;034(74): 2061-2068  (http://education.[facility].com/faq/FAQ164)    Total CHOL/HDL Ratio 4.7 <5.0 (calc)   Non-HDL Cholesterol (Calc) 122 <130 mg/dL (calc)    Comment: For patients with diabetes plus 1 major ASCVD risk  factor, treating to a non-HDL-C goal of <100 mg/dL  (LDL-C of <70 mg/dL) is considered a therapeutic  option.      Assessment & Plan:    1. Medicare annual wellness visit, subsequent - Ambulatory referral to Gastroenterology - US ABDOMINAL AORTA SCREENING AAA; Future  Exercise Activities and Dietary recommendations Goals    . Reduce portion size     Recommend to reduce portion size of meals to 3 small healthy meals per day and 2 healthy snacks per day      Discussed health benefits of physical activity, and encouraged him to engage in regular exercise appropriate for his age and condition.   Immunization History  Administered Date(s) Administered  . Influenza, High Dose Seasonal PF 08/27/2016, 09/08/2017, 09/22/2018  . Pneumococcal Conjugate-13 01/21/2018  . Pneumococcal Polysaccharide-23 08/27/2016  . Zoster 10/24/2016    Health Maintenance  Topic Date Due  . Samul Dada  11/05/1962  . COLONOSCOPY  11/29/2016   . INFLUENZA VACCINE  06/25/2018  . PNA vac Low Risk Adult  Completed     Current Outpatient Medications:  .  amLODipine (NORVASC) 5 MG tablet, Take 1 tablet (5 mg total) by mouth daily., Disp: 90 tablet, Rfl: 1 .  aspirin EC 81 MG tablet, Take 1 tablet (81 mg total) by mouth daily., Disp: 30 tablet, Rfl: 0 .  atorvastatin (LIPITOR) 40 MG tablet, Take 1 tablet (40 mg total) by mouth daily at 6 PM., Disp: 90 tablet, Rfl: 1 .  fluticasone (FLONASE) 50 MCG/ACT nasal spray, 2 sprays q nostril qhs, Disp: 48 g, Rfl: 1 .  losartan-hydrochlorothiazide (HYZAAR) 50-12.5 MG tablet, Take 1 tablet by mouth daily., Disp: 90 tablet, Rfl: 1 .  omeprazole (PRILOSEC) 20 MG capsule, TAKE 1 CAPSULE BY MOUTH EVERY DAY, Disp: 90 capsule, Rfl: 1 .  propranolol (INDERAL) 20 MG tablet, TAKE 1 TABLET BY MOUTH TWICE A DAY, Disp: 180 tablet, Rfl: 1 There are no discontinued medications.  I have personally reviewed and addressed the Medicare Annual Wellness health risk assessment questionnaire and have noted the following in the patient's chart:  A.         Medical and social history & family history B.         Use of alcohol, tobacco or illicit drugs  C.         Current medications  and supplements D.         Functional and Cognitive ability and status E.         Nutritional status F.         Physical activity G.        Advance directives H.         List of other physicians I.          Hospitalizations, surgeries, and ER visits in previous 12 months J.         [place] such as hearing and vision if needed, cognitive and depression L.         Referrals and appointments - Colonoscopy and AAA screening  In addition, I have reviewed and discussed with patient certain preventive protocols, quality metrics, and best practice recommendations. A written personalized care plan for preventive services as well as general preventive health recommendations were provided to patient.  2. Screening for  colorectal cancer - Ambulatory referral to Gastroenterology 3. Screening for AAA (abdominal aortic aneurysm) - US ABDOMINAL AORTA SCREENING AAA; Future

## 2018-09-23 ENCOUNTER — Other Ambulatory Visit: Payer: Self-pay

## 2018-09-23 ENCOUNTER — Encounter: Payer: Self-pay | Admitting: Gastroenterology

## 2018-09-28 ENCOUNTER — Other Ambulatory Visit: Payer: Self-pay

## 2018-09-28 MED ORDER — NA SULFATE-K SULFATE-MG SULF 17.5-3.13-1.6 GM/177ML PO SOLN: 1.0000 | Freq: Once | ORAL | 0 refills | Status: AC

## 2018-09-30 ENCOUNTER — Ambulatory Visit
Admission: RE | Admit: 2018-09-30 | Discharge: 2018-09-30 | Disposition: A | Discharge status: Home / Self Care | Payer: Medicare Other | Source: Ambulatory Visit | Attending: Family Medicine | Admitting: Family Medicine

## 2018-09-30 DIAGNOSIS — I77811 Abdominal aortic ectasia: Secondary | ICD-10-CM | POA: Diagnosis not present

## 2018-09-30 DIAGNOSIS — Z0000 Encounter for general adult medical examination without abnormal findings: Secondary | ICD-10-CM | POA: Diagnosis present

## 2018-09-30 DIAGNOSIS — Z136 Encounter for screening for cardiovascular disorders: Secondary | ICD-10-CM | POA: Diagnosis not present

## 2018-09-30 DIAGNOSIS — Z87891 Personal history of nicotine dependence: Secondary | ICD-10-CM | POA: Diagnosis not present

## 2018-10-13 ENCOUNTER — Ambulatory Visit
Admission: RE | Admit: 2018-10-13 | Discharge: 2018-10-13 | Disposition: A | Discharge status: Home / Self Care | Payer: Medicare Other | Source: Ambulatory Visit | Attending: Gastroenterology | Admitting: Gastroenterology

## 2018-10-13 ENCOUNTER — Encounter
Admission: RE | Disposition: A | Discharge status: Home / Self Care | Payer: Self-pay | Source: Ambulatory Visit | Attending: Gastroenterology

## 2018-10-13 ENCOUNTER — Ambulatory Visit: Payer: Medicare Other | Admitting: Certified Registered"

## 2018-10-13 ENCOUNTER — Encounter: Payer: Self-pay | Admitting: *Deleted

## 2018-10-13 DIAGNOSIS — Z09 Encounter for follow-up examination after completed treatment for conditions other than malignant neoplasm: Secondary | ICD-10-CM | POA: Insufficient documentation

## 2018-10-13 DIAGNOSIS — K640 First degree hemorrhoids: Secondary | ICD-10-CM | POA: Insufficient documentation

## 2018-10-13 DIAGNOSIS — Z860100 Personal history of colon polyps, unspecified: Secondary | ICD-10-CM

## 2018-10-13 DIAGNOSIS — D124 Benign neoplasm of descending colon: Secondary | ICD-10-CM | POA: Diagnosis not present

## 2018-10-13 DIAGNOSIS — Z7982 Long term (current) use of aspirin: Secondary | ICD-10-CM | POA: Diagnosis not present

## 2018-10-13 DIAGNOSIS — I10 Essential (primary) hypertension: Secondary | ICD-10-CM | POA: Diagnosis not present

## 2018-10-13 DIAGNOSIS — K219 Gastro-esophageal reflux disease without esophagitis: Secondary | ICD-10-CM | POA: Diagnosis not present

## 2018-10-13 DIAGNOSIS — Z951 Presence of aortocoronary bypass graft: Secondary | ICD-10-CM | POA: Diagnosis not present

## 2018-10-13 DIAGNOSIS — D123 Benign neoplasm of transverse colon: Secondary | ICD-10-CM | POA: Diagnosis not present

## 2018-10-13 DIAGNOSIS — I739 Peripheral vascular disease, unspecified: Secondary | ICD-10-CM | POA: Diagnosis not present

## 2018-10-13 DIAGNOSIS — D125 Benign neoplasm of sigmoid colon: Secondary | ICD-10-CM

## 2018-10-13 DIAGNOSIS — Z87891 Personal history of nicotine dependence: Secondary | ICD-10-CM | POA: Insufficient documentation

## 2018-10-13 DIAGNOSIS — E785 Hyperlipidemia, unspecified: Secondary | ICD-10-CM | POA: Diagnosis not present

## 2018-10-13 DIAGNOSIS — Z85820 Personal history of malignant melanoma of skin: Secondary | ICD-10-CM | POA: Diagnosis not present

## 2018-10-13 DIAGNOSIS — K635 Polyp of colon: Secondary | ICD-10-CM

## 2018-10-13 DIAGNOSIS — Z79899 Other long term (current) drug therapy: Secondary | ICD-10-CM | POA: Insufficient documentation

## 2018-10-13 DIAGNOSIS — D122 Benign neoplasm of ascending colon: Secondary | ICD-10-CM | POA: Insufficient documentation

## 2018-10-13 HISTORY — PX: COLONOSCOPY WITH PROPOFOL: SHX5780

## 2018-10-13 SURGERY — COLONOSCOPY WITH PROPOFOL
Anesthesia: General

## 2018-10-13 MED ORDER — LACTATED RINGERS IV SOLN
INTRAVENOUS | Status: DC | PRN
  Administered 2018-10-13: 10:00:00 via INTRAVENOUS

## 2018-10-13 MED ORDER — PROPOFOL 10 MG/ML IV BOLUS
INTRAVENOUS | Status: DC | PRN
  Administered 2018-10-13: 20 mg via INTRAVENOUS
  Administered 2018-10-13: 50 mg via INTRAVENOUS

## 2018-10-13 MED ORDER — SODIUM CHLORIDE 0.9 % IV SOLN
INTRAVENOUS | Status: DC
  Administered 2018-10-13: 1000 mL via INTRAVENOUS

## 2018-10-13 MED ORDER — PROPOFOL 500 MG/50ML IV EMUL
INTRAVENOUS | Status: DC | PRN
  Administered 2018-10-13: 100 ug/kg/min via INTRAVENOUS

## 2018-10-13 MED ORDER — PHENYLEPHRINE HCL 10 MG/ML IJ SOLN
INTRAMUSCULAR | Status: DC | PRN
  Administered 2018-10-13 (×3): 40 ug via INTRAVENOUS

## 2018-10-13 MED ORDER — PROPOFOL 10 MG/ML IV BOLUS
INTRAVENOUS | Status: AC
  Filled 2018-10-13: qty 40

## 2018-10-13 MED ORDER — LIDOCAINE HCL (PF) 2 % IJ SOLN
INTRAMUSCULAR | Status: DC | PRN
  Administered 2018-10-13: 100 mg via INTRADERMAL

## 2018-10-13 NOTE — Op Note (Addendum)
[facility] Gastroenterology Patient Name: Zachary Meyer Procedure Date: 10/13/2018 10:27 AM MRN: [id number] Account #: [id number] Date of Birth: 1941/08/14 Admit Type: Outpatient Age: 77 Room: [facility] ENDO ROOM 4 Gender: Male Note Status: Finalized Procedure:            Colonoscopy Indications:          High risk colon cancer surveillance: Personal history                        of colonic polyps 12/22/2014 Providers:            Lucilla Lame MD, MD Referring MD:         Bethena Roys. Sowles, MD (Referring MD) Medicines:            Propofol per Anesthesia Complications:        No immediate complications. Procedure:            Pre-Anesthesia Assessment:                       - Prior to the procedure, a History and Physical was                        performed, and patient medications and allergies were                        reviewed. The patient's tolerance of previous                        anesthesia was also reviewed. The risks and benefits of                        the procedure and the sedation options and risks were                        discussed with the patient. All questions were                        answered, and informed consent was obtained. Prior                        Anticoagulants: The patient has taken no previous                        anticoagulant or antiplatelet agents. ASA Grade                        Assessment: II - A patient with mild systemic disease.                        After reviewing the risks and benefits, the patient was                        deemed in satisfactory condition to undergo the                        procedure.                       After obtaining informed consent, the colonoscope was  passed under direct vision. Throughout the procedure,                        the patient's blood pressure, pulse, and oxygen                        saturations were monitored continuously. The    Colonoscope was introduced through the anus and                        advanced to the the cecum, identified by appendiceal                        orifice and ileocecal valve. The colonoscopy was                        performed without difficulty. The patient tolerated the                        procedure well. The quality of the bowel preparation                        was excellent. Findings:      The perianal and digital rectal examinations were normal.      Three sessile polyps were found in the ascending colon. The polyps were       3 to 5 mm in size. These polyps were removed with a cold biopsy forceps.       Resection and retrieval were complete.      A 4 mm polyp was found in the transverse colon. The polyp was sessile.       The polyp was removed with a cold biopsy forceps. Resection and       retrieval were complete.      A 3 mm polyp was found in the sigmoid colon. The polyp was sessile. The       polyp was removed with a cold biopsy forceps. Resection and retrieval       were complete.      Non-bleeding internal hemorrhoids were found during retroflexion. The       hemorrhoids were Grade I (internal hemorrhoids that do not prolapse). Impression:           - Three 3 to 5 mm polyps in the ascending colon,                        removed with a cold biopsy forceps. Resected and                        retrieved.                       - One 4 mm polyp in the transverse colon, removed with                        a cold biopsy forceps. Resected and retrieved.                       - One 3 mm polyp in the sigmoid colon, removed with a  cold biopsy forceps. Resected and retrieved.                       - Non-bleeding internal hemorrhoids. Recommendation:       - Discharge patient to home.                       - Resume previous diet.                       - Continue present medications.                       - Await pathology results.                       -  Repeat colonoscopy in 5 years for surveillance. Procedure Code(s):    --- Professional ---                       [phone], Colonoscopy, flexible; with biopsy, single or                        multiple Diagnosis Code(s):    --- Professional ---                       Z86.010, Personal history of colonic polyps                       D12.2, Benign neoplasm of ascending colon                       D12.3, Benign neoplasm of transverse colon (hepatic                        flexure or splenic flexure)                       D12.5, Benign neoplasm of sigmoid colon CPT copyright 2018 American Medical Association. All rights reserved. The codes documented in this report are preliminary and upon coder review may  be revised to meet current compliance requirements. Lucilla Lame MD, MD 10/13/2018 10:48:03 AM This report has been signed electronically. Number of Addenda: 0 Note Initiated On: 10/13/2018 10:27 AM Scope Withdrawal Time: 0 hours 9 minutes 53 seconds  Total Procedure Duration: 0 hours 11 minutes 7 seconds       [facility]

## 2018-10-13 NOTE — Transfer of Care (Signed)
Immediate Anesthesia Transfer of Care Note  Patient: Zachary Meyer  Procedure(s) Performed: COLONOSCOPY WITH PROPOFOL (N/A )  Patient Location: PACU  Anesthesia Type:MAC  Level of Consciousness: awake, alert , oriented and patient cooperative  Airway & Oxygen Therapy: Patient Spontanous Breathing and Patient connected to nasal cannula oxygen  Post-op Assessment: Report given to RN and Post -op Vital signs reviewed and stable  Post vital signs: Reviewed and stable  Last Vitals:  Vitals Value Taken Time  BP 95/71 10/13/2018 10:50 AM  Temp 36.1 C 10/13/2018 10:50 AM  Pulse 94 10/13/2018 10:51 AM  Resp 14 10/13/2018 10:51 AM  SpO2 96 % 10/13/2018 10:51 AM  Vitals shown include unvalidated device data.  Last Pain:  Vitals:   10/13/18 1050  TempSrc: Tympanic  PainSc: 0-No pain         Complications: No apparent anesthesia complications

## 2018-10-13 NOTE — H&P (Signed)
Lucilla Lame, MD [address] [place], [place] [address] Phone:[phone] Fax : [phone]  Primary Care Physician:  Steele Sizer, MD Primary Gastroenterologist:  Dr. Allen Norris  Pre-Procedure History & Physical: HPI:  Zachary Meyer is a 77 y.o. male is here for an colonoscopy.   Past Medical History:  Diagnosis Date  . GERD (gastroesophageal reflux disease)   . Hyperlipidemia   . Hypertension     Past Surgical History:  Procedure Laterality Date  . COLONOSCOPY WITH PROPOFOL    . MELANOMA EXCISION Left 12/2012    Prior to Admission medications   Medication Sig Start Date End Date Taking? Authorizing Provider  propranolol (INDERAL) 20 MG tablet TAKE 1 TABLET BY MOUTH TWICE A DAY 07/05/18  Yes Sowles, Drue Stager, MD  amLODipine (NORVASC) 5 MG tablet Take 1 tablet (5 mg total) by mouth daily. 07/21/18   Steele Sizer, MD  aspirin EC 81 MG tablet Take 1 tablet (81 mg total) by mouth daily. 01/21/18   Steele Sizer, MD  atorvastatin (LIPITOR) 40 MG tablet Take 1 tablet (40 mg total) by mouth daily at 6 PM. 07/21/18   Ancil Boozer, Drue Stager, MD  fluticasone Asencion Islam) 50 MCG/ACT nasal spray 2 sprays q nostril qhs 07/21/18   Sowles, Drue Stager, MD  losartan-hydrochlorothiazide (HYZAAR) 50-12.5 MG tablet Take 1 tablet by mouth daily. 07/21/18   Steele Sizer, MD  omeprazole (PRILOSEC) 20 MG capsule TAKE 1 CAPSULE BY MOUTH EVERY DAY 07/05/18   Steele Sizer, MD    Allergies as of 09/23/2018  . (No Known Allergies)    Family History  Problem Relation Age of Onset  . Hypertension Sister   . Hypertension Brother   . Heart attack Mother     Social History   Socioeconomic History  . Marital status: Married    Spouse name: [name]   . Number of children: 2  . Years of education: Not on file  . Highest education level: 10th grade  Occupational History  . 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 Financial resource  strain: Not hard at all  . Food insecurity:    Worry: Never true    Inability: Never true  . Transportation needs:    Medical: No    Non-medical: No  Tobacco Use  . Smoking status: Former Smoker    Packs/day: 0.50    Years: 10.00    Pack years: 5.00    Types: Cigarettes    Start date: 11/25/1953    Last attempt to quit: 01/22/1964    Years since quitting: 54.7  . Smokeless tobacco: Never Used  Substance and Sexual Activity  . Alcohol use: No    Alcohol/week: 0.0 standard drinks  . Drug use: No  . Sexual activity: Never  Lifestyle  . Physical activity:    Days per week: 3 days    Minutes per session: 30 min  . Stress: Not at all  Relationships  . Social connections:    Talks on phone: Three times a week    Gets together: More than three times a week    Attends religious service: More than 4 times per year    Active member of club or organization: Yes    Attends meetings of clubs or organizations: More than 4 times per year    Relationship status: Married  . Intimate partner violence:    Fear of current or ex partner: No    Emotionally abused:  No    Physically abused: No    Forced sexual activity: No  Other Topics Concern  . Not on file  Social History Narrative  . Not on file    Review of Systems: See HPI, otherwise negative ROS  Physical Exam: BP (!) 161/95   Pulse (!) 112   Temp 97.8 F (36.6 C) (Tympanic)   Resp 16   Ht 6' (1.829 m)   Wt 94.3 kg   SpO2 100%   BMI 28.21 kg/m  General:   Alert,  pleasant and cooperative in NAD Head:  Normocephalic and atraumatic. Neck:  Supple; no masses or thyromegaly. Lungs:  Clear throughout to auscultation.    Heart:  Regular rate and rhythm. Abdomen:  Soft, nontender and nondistended. Normal bowel sounds, without guarding, and without rebound.   Neurologic:  Alert and  oriented x4;  grossly normal neurologically.  Impression/Plan: Zachary Meyer is here for an colonoscopy to be performed for history of colon  polyps 12/22/2014  Risks, benefits, limitations, and alternatives regarding  colonoscopy have been reviewed with the patient.  Questions have been answered.  All parties agreeable.   Lucilla Lame, MD  10/13/2018, 10:25 AM

## 2018-10-13 NOTE — Anesthesia Preprocedure Evaluation (Addendum)
Anesthesia Evaluation  Patient identified by MRN, date of birth, ID band Patient awake    Reviewed: Allergy & Precautions, H&P , NPO status , Patient's Chart, lab work & pertinent test results  History of Anesthesia Complications Negative for: history of anesthetic complications  Airway Mallampati: III       Dental  (+) Poor Dentition, Chipped   Pulmonary neg COPD, former smoker,           Cardiovascular hypertension, + Peripheral Vascular Disease  (-) Past MI, (-) Cardiac Stents and (-) CABG (-) dysrhythmias      Neuro/Psych negative neurological ROS  negative psych ROS   GI/Hepatic Neg liver ROS, GERD  ,  Endo/Other  negative endocrine ROS  Renal/GU CRFRenal disease  negative genitourinary   Musculoskeletal   Abdominal   Peds  Hematology negative hematology ROS (+)   Anesthesia Other Findings Past Medical History: No date: GERD (gastroesophageal reflux disease) No date: Hyperlipidemia No date: Hypertension  Past Surgical History: No date: COLONOSCOPY WITH PROPOFOL 12/2012: MELANOMA EXCISION; Left  BMI    Body Mass Index:  28.21 kg/m      Reproductive/Obstetrics negative OB ROS                            Anesthesia Physical Anesthesia Plan  ASA: II  Anesthesia Plan: General   Post-op Pain Management:    Induction:   PONV Risk Score and Plan: Propofol infusion and TIVA  Airway Management Planned: Natural Airway and Nasal Cannula  Additional Equipment:   Intra-op Plan:   Post-operative Plan:   Informed Consent: I have reviewed the patients History and Physical, chart, labs and discussed the procedure including the risks, benefits and alternatives for the proposed anesthesia with the patient or authorized representative who has indicated his/her understanding and acceptance.   Dental Advisory Given  Plan Discussed with: Anesthesiologist, CRNA and  Surgeon  Anesthesia Plan Comments:         Anesthesia Quick Evaluation

## 2018-10-13 NOTE — Anesthesia Post-op Follow-up Note (Signed)
Anesthesia QCDR form completed.        

## 2018-10-14 LAB — SURGICAL PATHOLOGY

## 2018-10-14 NOTE — Anesthesia Postprocedure Evaluation (Signed)
Anesthesia Post Note  Patient: Zachary Meyer  Procedure(s) Performed: COLONOSCOPY WITH PROPOFOL (N/A )  Patient location during evaluation: PACU Anesthesia Type: General Level of consciousness: awake and alert Pain management: pain level controlled Vital Signs Assessment: post-procedure vital signs reviewed and stable Respiratory status: spontaneous breathing, nonlabored ventilation and respiratory function stable Cardiovascular status: blood pressure returned to baseline and stable Postop Assessment: no apparent nausea or vomiting Anesthetic complications: no     Last Vitals:  Vitals:   10/13/18 1050 10/13/18 1111  BP:  134/89  Pulse:    Resp:    Temp: (!) 36.1 C   SpO2:      Last Pain:  Vitals:   10/14/18 0737  TempSrc:   PainSc: 0-No pain                 Durenda Hurt

## 2018-10-15 ENCOUNTER — Encounter: Payer: Self-pay | Admitting: Gastroenterology

## 2018-10-17 ENCOUNTER — Other Ambulatory Visit: Payer: Self-pay | Admitting: Family Medicine

## 2018-10-17 DIAGNOSIS — J310 Chronic rhinitis: Secondary | ICD-10-CM

## 2018-12-28 ENCOUNTER — Other Ambulatory Visit: Payer: Self-pay | Admitting: Family Medicine

## 2018-12-28 DIAGNOSIS — G250 Essential tremor: Secondary | ICD-10-CM

## 2018-12-28 DIAGNOSIS — K219 Gastro-esophageal reflux disease without esophagitis: Secondary | ICD-10-CM

## 2019-01-21 ENCOUNTER — Encounter: Payer: Self-pay | Admitting: Family Medicine

## 2019-01-21 ENCOUNTER — Ambulatory Visit: Payer: Medicare Other | Admitting: Family Medicine

## 2019-01-21 VITALS — BP 130/80 | Temp 98.0°F | Resp 16 | Ht 72.0 in | Wt 212.1 lb

## 2019-01-21 DIAGNOSIS — E785 Hyperlipidemia, unspecified: Secondary | ICD-10-CM | POA: Diagnosis not present

## 2019-01-21 DIAGNOSIS — I10 Essential (primary) hypertension: Secondary | ICD-10-CM

## 2019-01-21 DIAGNOSIS — D692 Other nonthrombocytopenic purpura: Secondary | ICD-10-CM

## 2019-01-21 DIAGNOSIS — K219 Gastro-esophageal reflux disease without esophagitis: Secondary | ICD-10-CM

## 2019-01-21 DIAGNOSIS — E559 Vitamin D deficiency, unspecified: Secondary | ICD-10-CM

## 2019-01-21 DIAGNOSIS — G250 Essential tremor: Secondary | ICD-10-CM | POA: Diagnosis not present

## 2019-01-21 DIAGNOSIS — N183 Chronic kidney disease, stage 3 unspecified: Secondary | ICD-10-CM

## 2019-01-21 DIAGNOSIS — I77811 Abdominal aortic ectasia: Secondary | ICD-10-CM

## 2019-01-21 DIAGNOSIS — R739 Hyperglycemia, unspecified: Secondary | ICD-10-CM

## 2019-01-21 MED ORDER — LOSARTAN POTASSIUM-HCTZ 50-12.5 MG PO TABS: 1.0000 | ORAL_TABLET | Freq: Every day | ORAL | 1 refills | Status: DC

## 2019-01-21 MED ORDER — AMLODIPINE BESYLATE 5 MG PO TABS: 5.0000 mg | ORAL_TABLET | Freq: Every day | ORAL | 1 refills | Status: DC

## 2019-01-21 MED ORDER — PROPRANOLOL HCL 20 MG PO TABS: 20.0000 mg | ORAL_TABLET | Freq: Two times a day (BID) | ORAL | 1 refills | Status: DC

## 2019-01-21 MED ORDER — OMEPRAZOLE 20 MG PO CPDR: 20.0000 mg | DELAYED_RELEASE_CAPSULE | Freq: Every day | ORAL | 0 refills | Status: DC

## 2019-01-21 MED ORDER — VITAMIN D3 50 MCG (2000 UT) PO TABS: 1.0000 | ORAL_TABLET | Freq: Every day | ORAL | 0 refills | Status: AC

## 2019-01-21 MED ORDER — ATORVASTATIN CALCIUM 40 MG PO TABS: 40.0000 mg | ORAL_TABLET | Freq: Every day | ORAL | 1 refills | Status: DC

## 2019-01-21 NOTE — Progress Notes (Signed)
Name: Zachary Meyer   MRN: [id number]    DOB: 1941-02-16   Date:01/21/2019       Progress Note  Subjective  Chief Complaint  Chief Complaint  Patient presents with  . Medication Refill  . Hypertension    Denies any symptoms  . Dyslipidemia  . CKI stage III  . Tremors  . Allergic Rhinitis     HPI  HTN: He takes ARB, HCTZ and low dose Norvasc. He is on Propanolol but was added by neurologist for essential tremors. He denies chest pain, palpitation or SOB, no recent episodes of dizziness   Dyslipidemia: he has low HDL, LDL still around 100, he is on high dose statin therapy . Lipitor 40 mg and discussed healthy diet  CKI stage III: based on low GFR for past 3  years,  avoid nsaid's, he is drinking more water and avoiding sweet tea,  take tylenol prn for pain  Hyperglycemia: no polyphagia, polydipsia or polyuria. Last A1C 5.9% in August 2019 and we will recheck yearly   Tremors: chronic, seen by neurologist, aggravated when he uses weed eater, otherwise stable, taking propanolol bid . He states worse when he holds something, like a cup of coffee   Senile purpura: he has easy bruising and thin skin,. Unchanged   AR: using nasal steroids at night when congestion is worse and is doing well on medication . Unchanged   Patient Active Problem List   Diagnosis Date Noted  . Personal history of colonic polyps   . Benign neoplasm of ascending colon   . Benign neoplasm of transverse colon   . Polyp of sigmoid colon   . Senile purpura ([facility]) 07/21/2018  . Chronic kidney disease, stage III (moderate) ([facility]) 07/21/2018  . Vitamin D deficiency 07/21/2018  . Hyperglycemia 01/23/2016  . Hypertension 07/25/2015  . GERD (gastroesophageal reflux disease) 07/25/2015  . Hyperlipidemia 07/25/2015  . Perennial allergic rhinitis 07/25/2015  . Benign essential tremor 07/25/2015    Past Surgical History:  Procedure Laterality Date  . COLONOSCOPY WITH PROPOFOL    . COLONOSCOPY WITH  PROPOFOL N/A 10/13/2018   Procedure: COLONOSCOPY WITH PROPOFOL;  Surgeon: Lucilla Lame, MD;  [facility] END[facility]COPY;  Service: Endoscopy;  Laterality: N/A;  . MELANOMA EXCISION Left 12/2012    Family History  Problem Relation Age of Onset  . Hypertension Sister   . Hypertension Brother   . Heart attack Mother     Social History   Socioeconomic History  . Marital status: Married    Spouse name: [name]   . Number of children: 2  . Years of education: Not on file  . Highest education level: 10th grade  Occupational History  . 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 Financial resource strain: Not hard at all  . Food insecurity:    Worry: Never true    Inability: Never true  . Transportation needs:    Medical: No    Non-medical: No  Tobacco Use  . Smoking status: Former Smoker    Packs/day: 0.50    Years: 10.00    Pack years: 5.00    Types: Cigarettes    Start date: 11/25/1953    Last attempt to quit: 01/22/1964    Years since quitting: 55.0  . Smokeless tobacco: Never Used  Substance and Sexual Activity  . Alcohol use: No    Alcohol/week: 0.0 standard drinks  . Drug use: No  . Sexual  activity: Never  Lifestyle  . Physical activity:    Days per week: 3 days    Minutes per session: 30 min  . Stress: Not at all  Relationships  . Social connections:    Talks on phone: Three times a week    Gets together: More than three times a week    Attends religious service: More than 4 times per year    Active member of club or organization: Yes    Attends meetings of clubs or organizations: More than 4 times per year    Relationship status: Married  . Intimate partner violence:    Fear of current or ex partner: No    Emotionally abused: No    Physically abused: No    Forced sexual activity: No  Other Topics Concern  . Not on file  Social History Narrative  . Not on file     Current Outpatient Medications:  .   amLODipine (NORVASC) 5 MG tablet, Take 1 tablet (5 mg total) by mouth daily., Disp: 90 tablet, Rfl: 1 .  aspirin EC 81 MG tablet, Take 1 tablet (81 mg total) by mouth daily., Disp: 30 tablet, Rfl: 0 .  atorvastatin (LIPITOR) 40 MG tablet, Take 1 tablet (40 mg total) by mouth daily at 6 PM., Disp: 90 tablet, Rfl: 1 .  Cholecalciferol (VITAMIN D3 PO), Take 1 tablet by mouth daily., Disp: , Rfl:  .  fluticasone (FLONASE) 50 MCG/ACT nasal spray, USE 2 SPRAYS INTO EACH NOSTRIL AT BEDTIME, Disp: 48 g, Rfl: 1 .  losartan-hydrochlorothiazide (HYZAAR) 50-12.5 MG tablet, Take 1 tablet by mouth daily., Disp: 90 tablet, Rfl: 1 .  omeprazole (PRILOSEC) 20 MG capsule, TAKE 1 CAPSULE BY MOUTH EVERY DAY, Disp: 90 capsule, Rfl: 0 .  propranolol (INDERAL) 20 MG tablet, TAKE 1 TABLET BY MOUTH TWICE A DAY (Patient taking differently: Take 20 mg by mouth 2 (two) times daily. ), Disp: 180 tablet, Rfl: 0  No Known Allergies  I personally reviewed active problem list, medication list, allergies, family history, social history with the patient/caregiver today.   ROS  Constitutional: Negative for fever, positive for mild  weight change.  Respiratory: Negative for cough and shortness of breath.   Cardiovascular: Negative for chest pain or palpitations.  Gastrointestinal: Negative for abdominal pain, no bowel changes.  Musculoskeletal: Negative for gait problem or joint swelling.  Skin: Negative for rash.  Neurological: Negative for dizziness or headache.  No other specific complaints in a complete review of systems (except as listed in HPI above).   Objective  Vitals:   01/21/19 0922  Resp: 16  Temp: 98 F (36.7 C)  TempSrc: Oral  SpO2: 97%  Weight: 212 lb 1.6 oz (96.2 kg)  Height: 6' (1.829 m)    Body mass index is 28.77 kg/m.  Physical Exam  Constitutional: Patient appears well-developed and well-nourished. Overweight. No distress.  HEENT: head atraumatic, normocephalic, pupils equal and reactive  to light, neck supple, throat within normal limits Cardiovascular: Normal rate, regular rhythm and normal heart sounds.  No murmur heard. No BLE edema. Pulmonary/Chest: Effort normal and breath sounds normal. No respiratory distress. Abdominal: Soft.  There is no tenderness. Neurological: very mild hand tremors  Skin: recent seen by Dr. Evorn Gong and had some biopsies, also mild senile purpura  Psychiatric: Patient has a normal mood and affect. behavior is normal. Judgment and thought content normal.  PHQ2/9: Depression screen [facility] 2/9 01/21/2019 09/22/2018 07/21/2018 10/20/2017 09/19/2017  Decreased Interest 0 0 0 0  0  Down, Depressed, Hopeless 0 0 0 0 0  PHQ - 2 Score 0 0 0 0 0  Altered sleeping 0 0 - - -  Tired, decreased energy 0 0 - - -  Change in appetite 0 0 - - -  Feeling bad or failure about yourself  0 0 - - -  Trouble concentrating 0 0 - - -  Moving slowly or fidgety/restless 0 0 - - -  Suicidal thoughts 0 0 - - -  PHQ-9 Score 0 0 - - -  Difficult doing work/chores Not difficult at all Not difficult at all - - -     Fall Risk: Fall Risk  01/21/2019 09/22/2018 07/21/2018 01/21/2018 10/20/2017  Falls in the past year? 0 No No No Yes  Number falls in past yr: - - - - 1  Injury with Fall? - - - - No  Comment - - - - -  Follow up - - - - Falls evaluation completed     Functional Status Survey: Is the patient deaf or have difficulty hearing?: No Does the patient have difficulty seeing, even when wearing glasses/contacts?: Yes Does the patient have difficulty concentrating, remembering, or making decisions?: No Does the patient have difficulty walking or climbing stairs?: No Does the patient have difficulty dressing or bathing?: No Does the patient have difficulty doing errands alone such as visiting a doctor's office or shopping?: No    Assessment & Plan  1. Dyslipidemia (high LDL; low HDL)  - atorvastatin (LIPITOR) 40 MG tablet; Take 1 tablet (40 mg total) by mouth daily  at 6 PM.  Dispense: 90 tablet; Refill: 1  2. Ectatic abdominal aorta ([place])  He is on statin therapy   3. Essential hypertension  - amLODipine (NORVASC) 5 MG tablet; Take 1 tablet (5 mg total) by mouth daily.  Dispense: 90 tablet; Refill: 1 - losartan-hydrochlorothiazide (HYZAAR) 50-12.5 MG tablet; Take 1 tablet by mouth daily.  Dispense: 90 tablet; Refill: 1  4. Benign essential tremor  - propranolol (INDERAL) 20 MG tablet; Take 1 tablet (20 mg total) by mouth 2 (two) times daily.  Dispense: 180 tablet; Refill: 1  5. Senile purpura ([facility])  Reassurance, on aspirin   6. Chronic kidney disease, stage III (moderate) ([facility])  - Cholecalciferol (VITAMIN D3) 50 MCG (2000 UT) TABS; Take 1 tablet by mouth daily.  Dispense: 30 tablet; Refill: 0  7. Gastroesophageal reflux disease, esophagitis presence not specified  - omeprazole (PRILOSEC) 20 MG capsule; Take 1 capsule (20 mg total) by mouth daily.  Dispense: 90 capsule; Refill: 0  8. Hyperglycemia  Discussed low sugar diet   9. Vitamin D deficiency  - Cholecalciferol (VITAMIN D3) 50 MCG (2000 UT) TABS; Take 1 tablet by mouth daily.  Dispense: 30 tablet; Refill: 0

## 2019-05-18 IMAGING — CR DG HUMERUS 2V *R*
1 series · 3 of 3 positions shown · non-contrast
Comparison: None.

CLINICAL DATA: Pain following fall

EXAM:
RIGHT HUMERUS - 2+ VIEW

[Series 1: dg humerus right · 0.14mm/px · 3 of 3 slices shown]
[im 1/3]
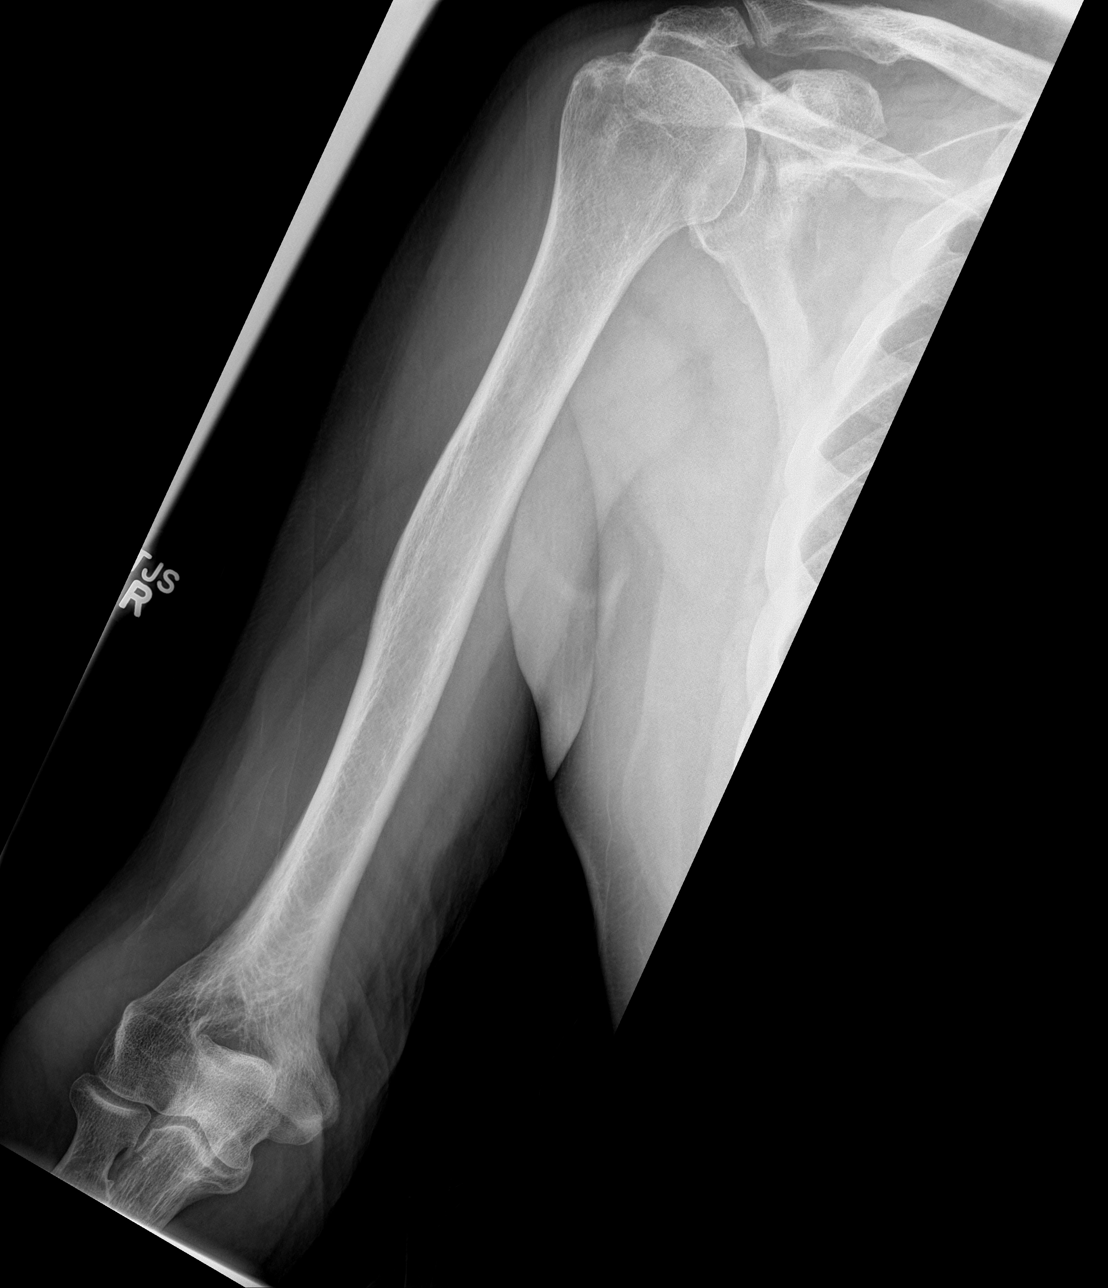
[im 2/3]
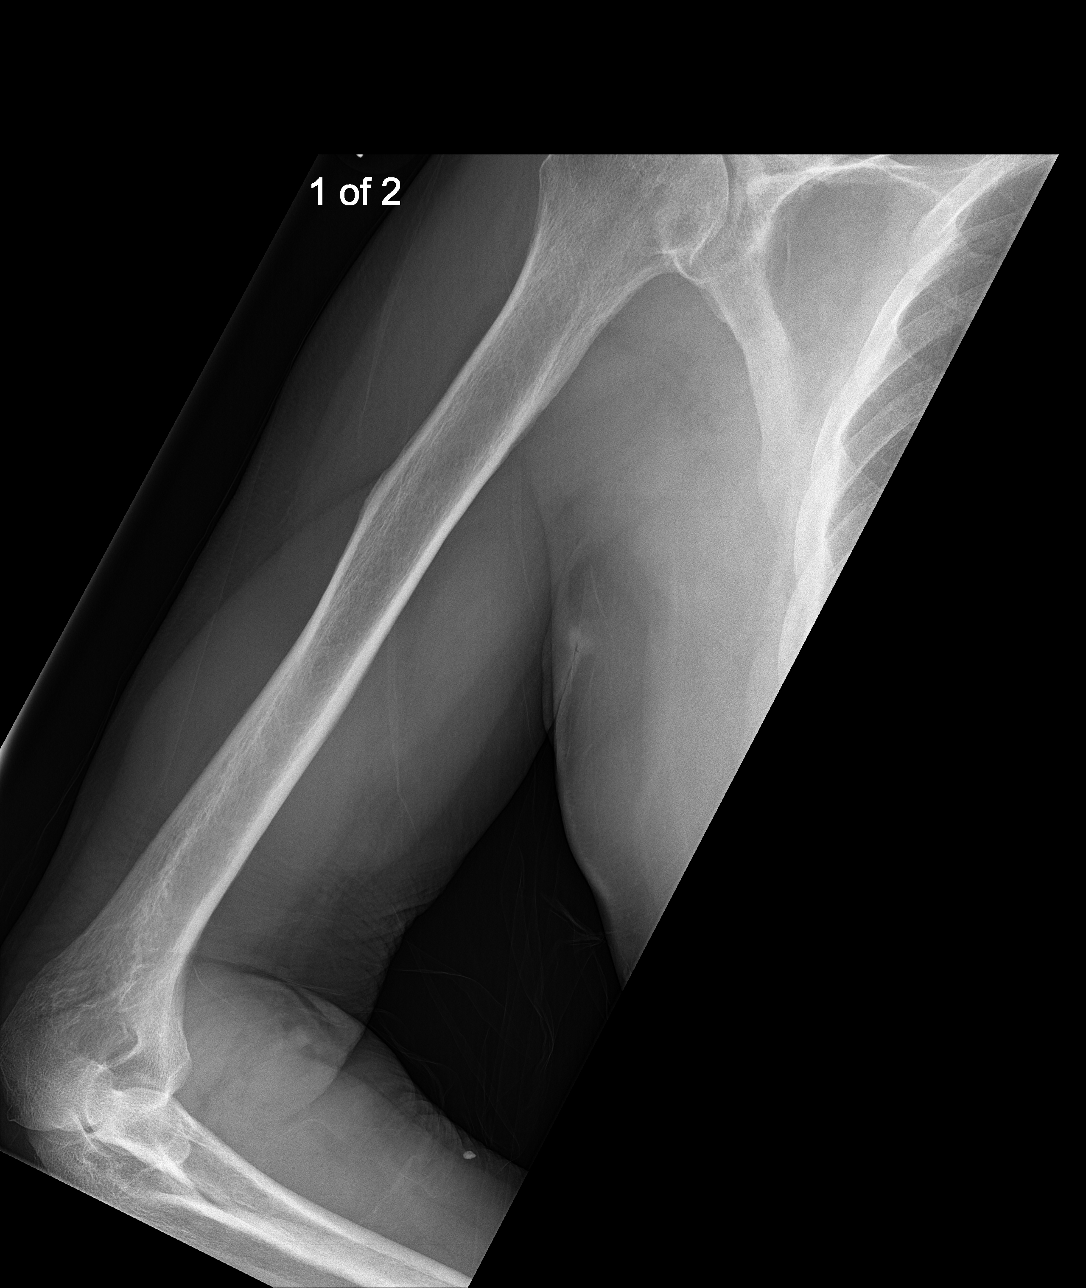
[im 3/3]
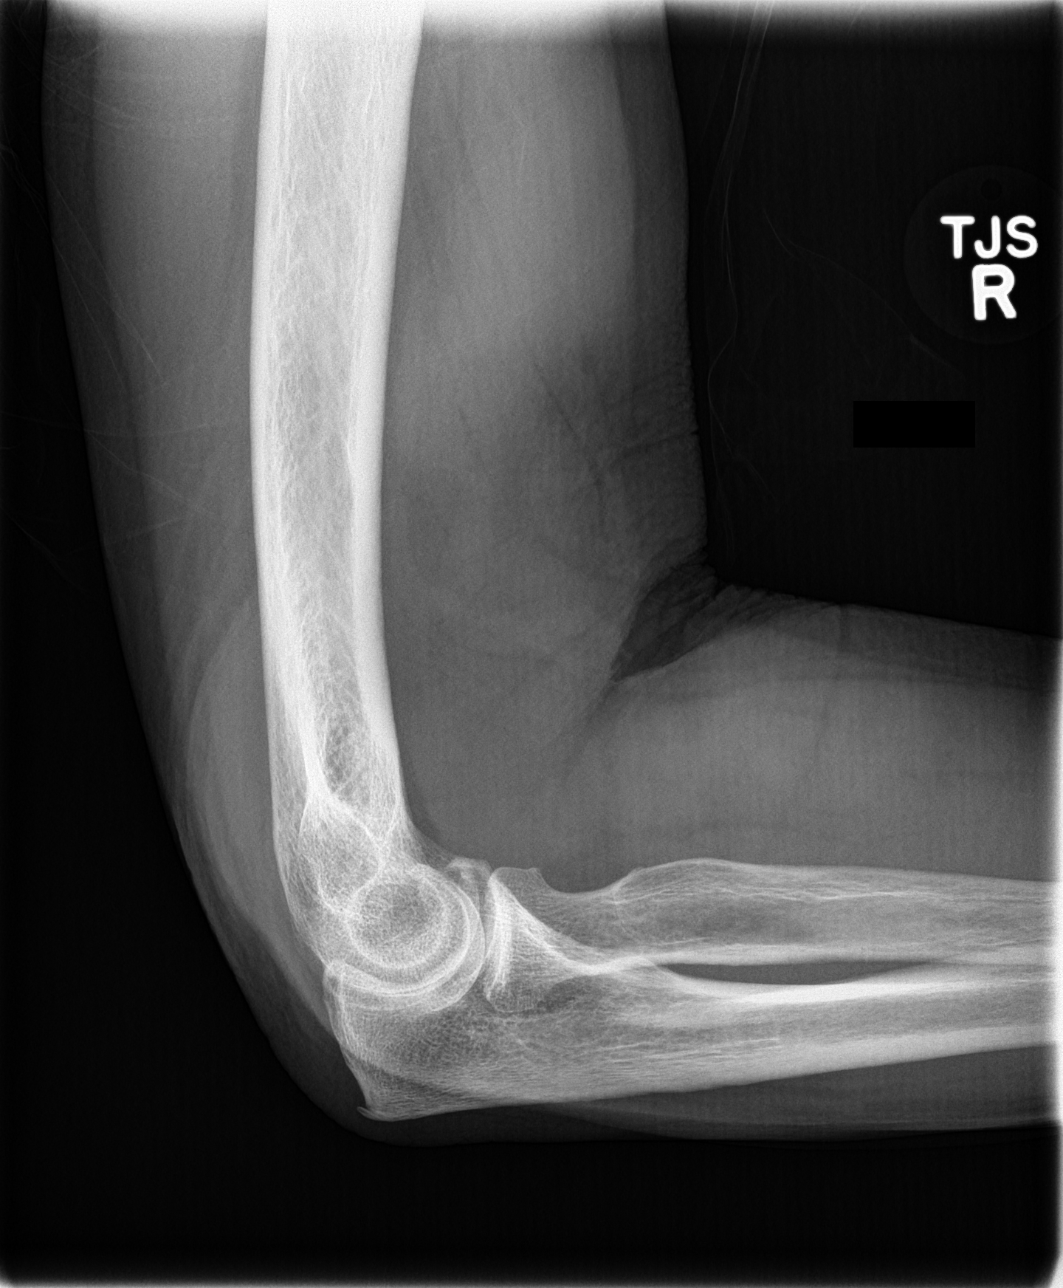

[3 of 3 positions shown; findings below may reference images not displayed]

FINDINGS: Frontal and lateral views were obtained. There is soft tissue
fullness posterior to the distal humerus. No acute fracture or
dislocation. There is osteoarthritic change in the right shoulder
joint and to a lesser extent in the right elbow joint region. There
is a small spur arising from the olecranon process of the proximal
ulna.
IMPRESSION: Soft tissue fullness posterior to the distal humerus. Question
hematoma or possibly tendon rupture. Clinical assessment in this
regard advised. No fracture or dislocation. Osteoarthritic change in
the right shoulder and to a lesser extent right elbow joint regions.

## 2019-05-18 IMAGING — CR DG SHOULDER 2+V*R*
1 series · 3 of 3 positions shown · non-contrast
Comparison: None.

CLINICAL DATA: Pain following fall

EXAM:
RIGHT SHOULDER - 2+ VIEW

[Series 1: dg shoulder right · 0.14mm/px · 3 of 3 slices shown]
[im 1/3]
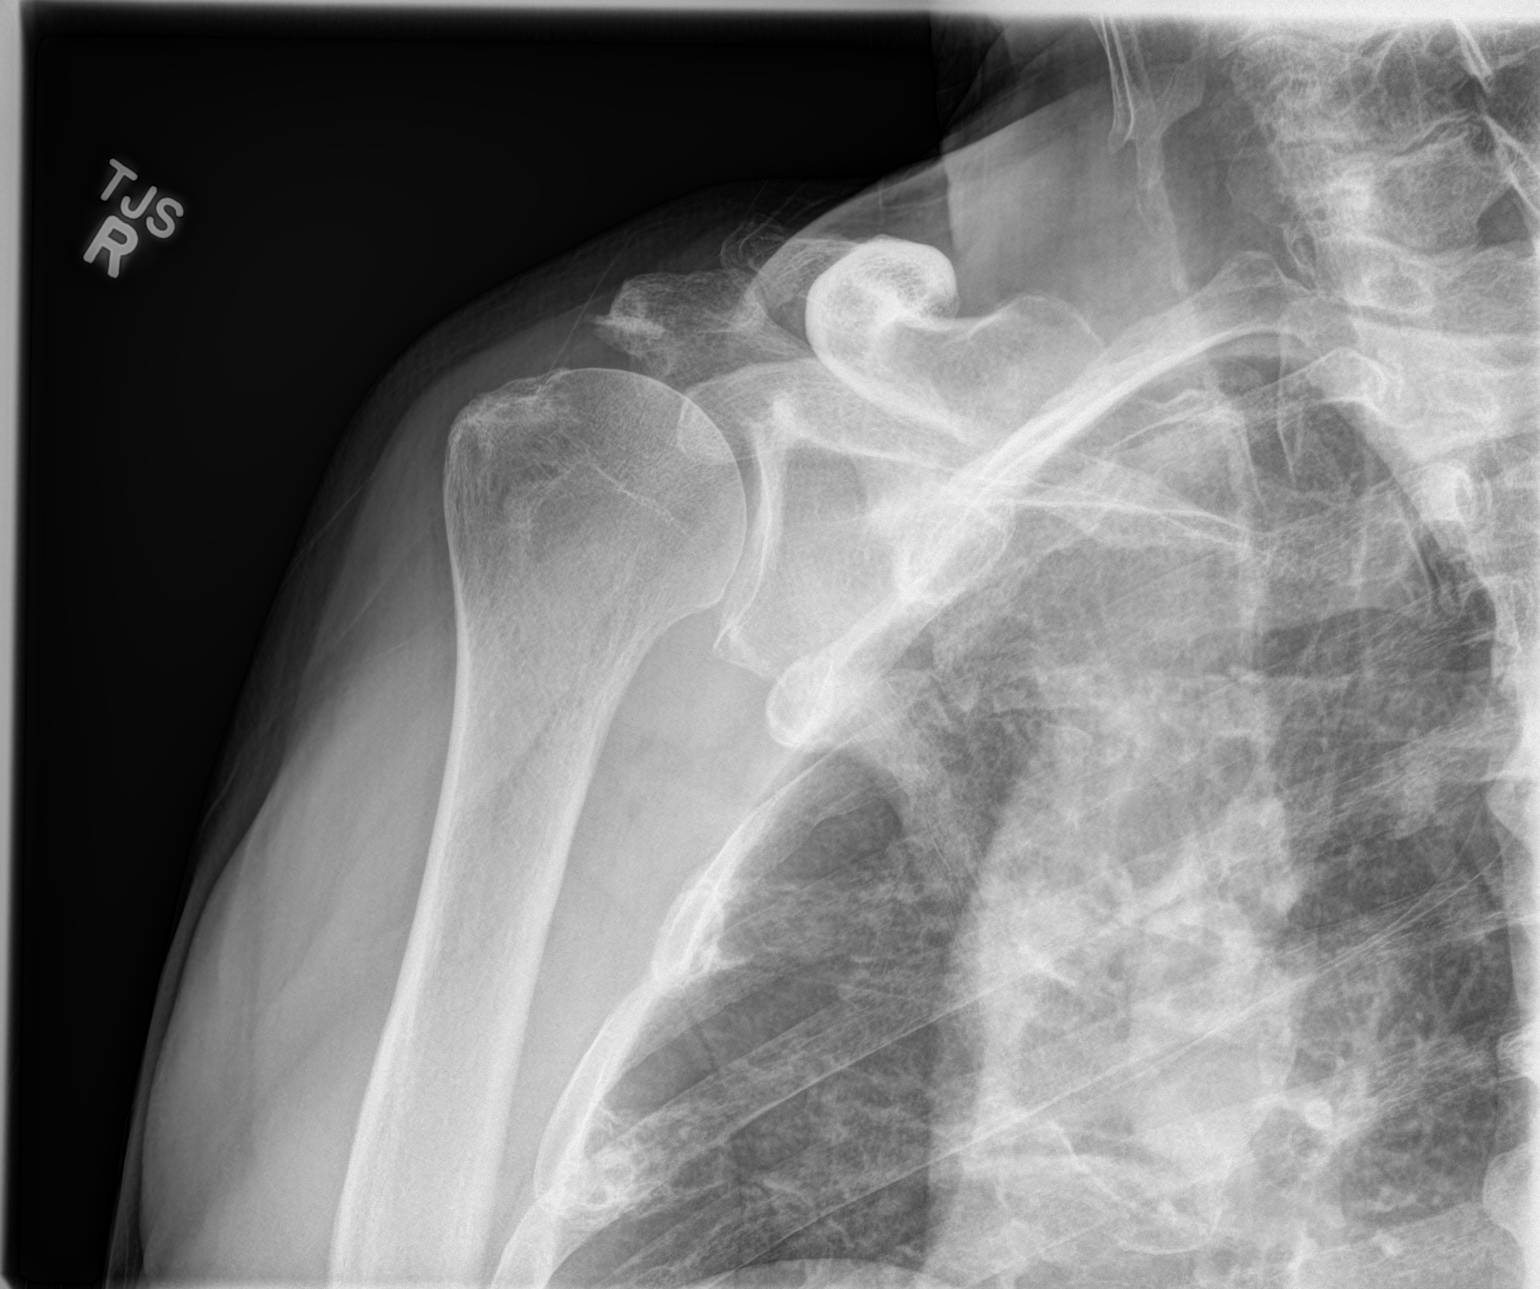
[im 2/3]
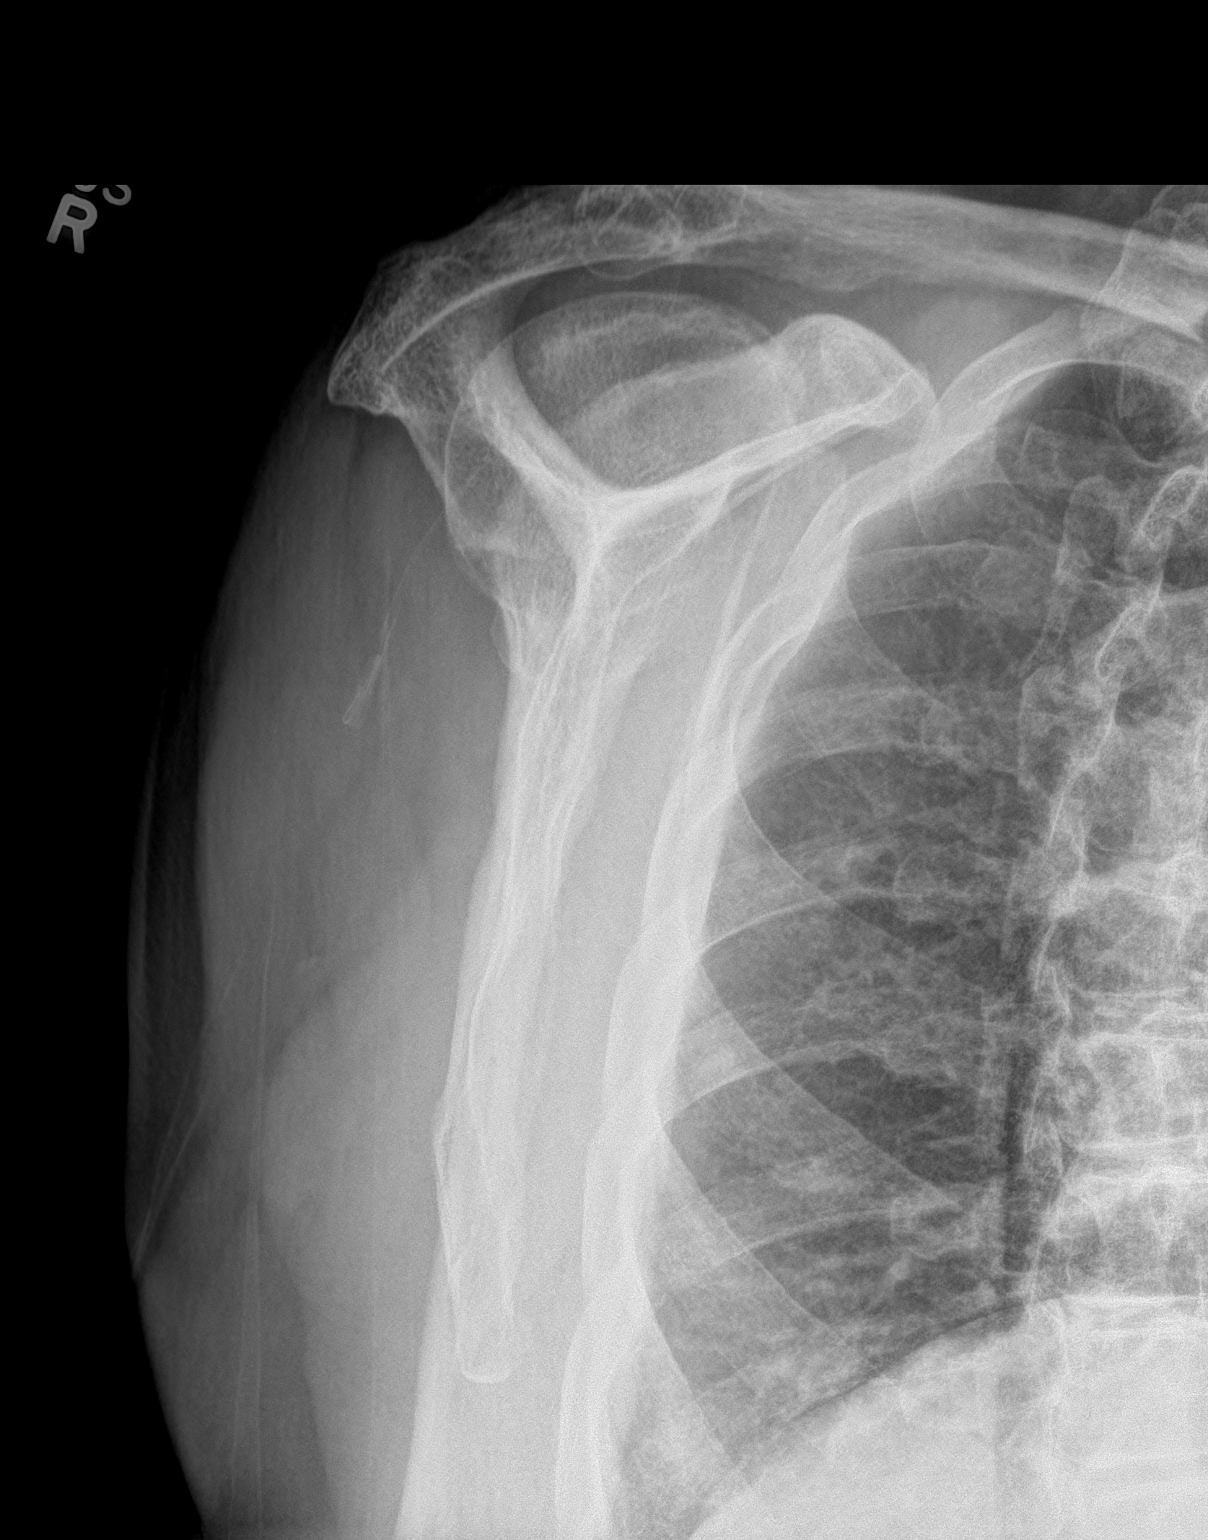
[im 3/3]
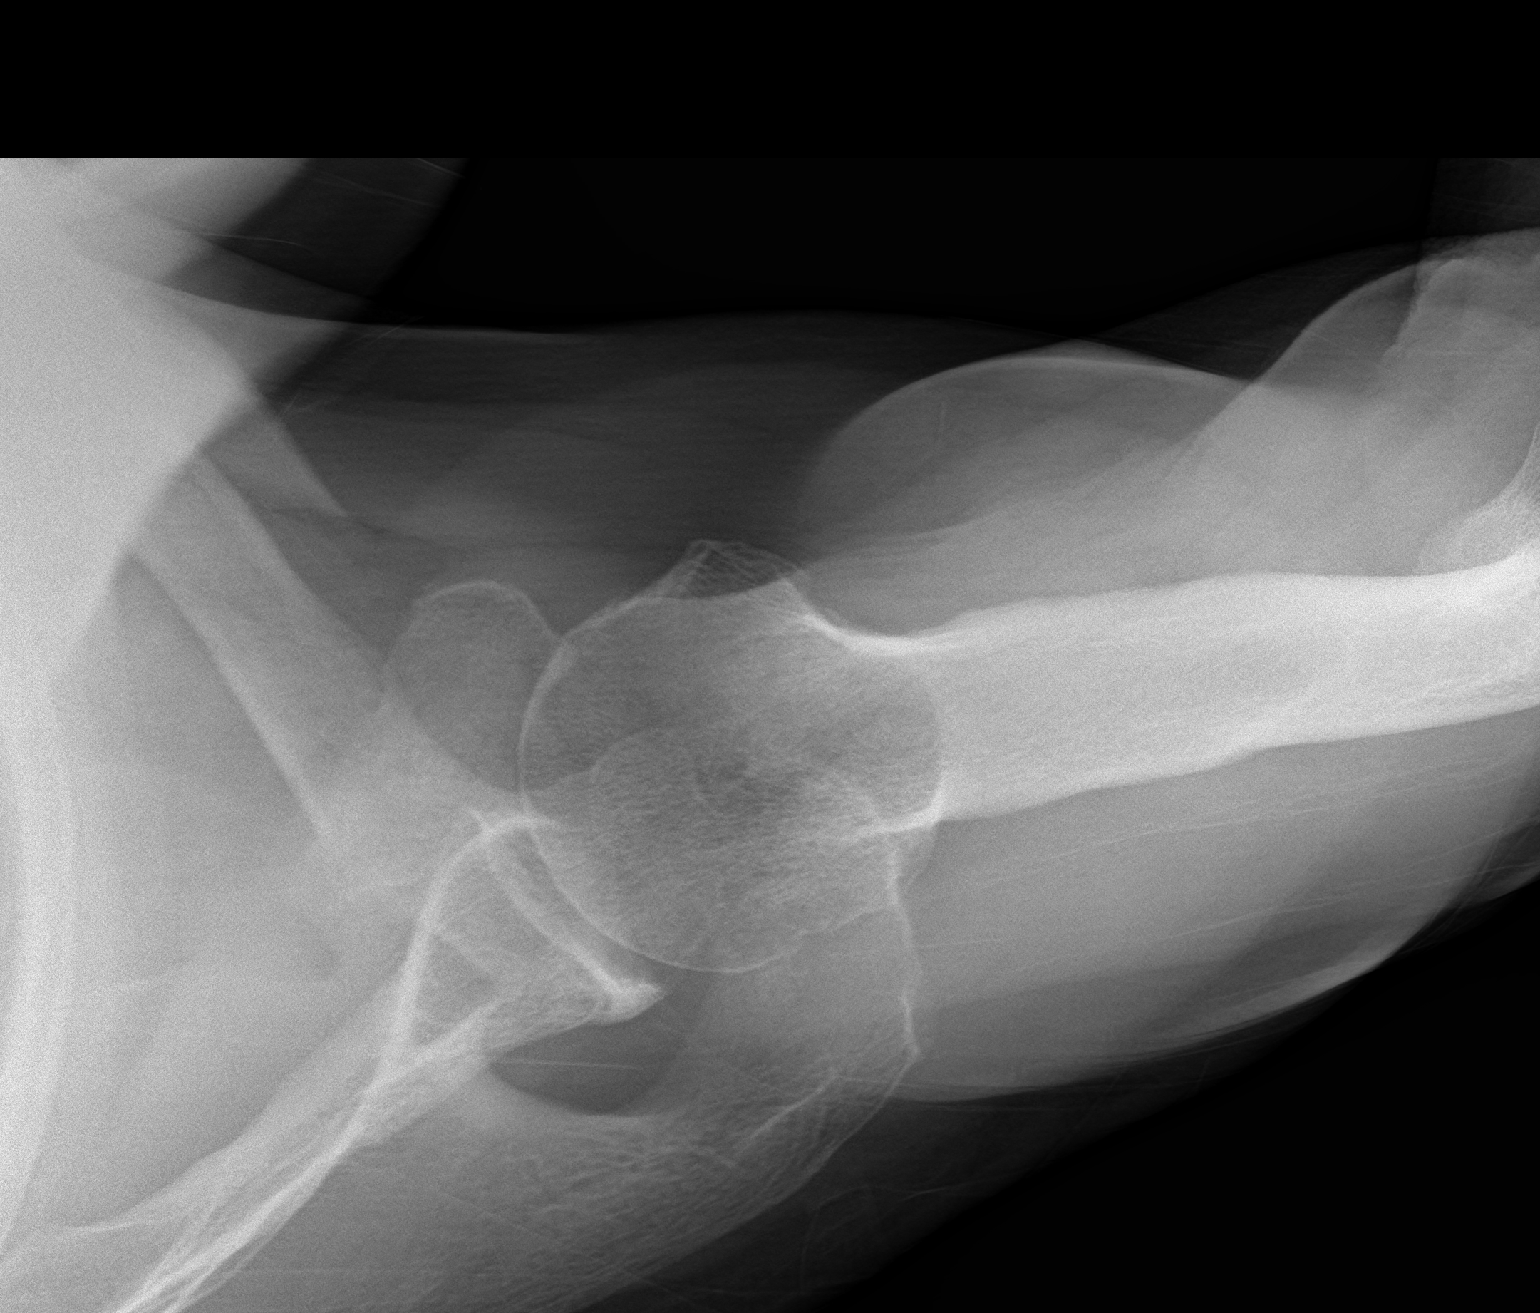

[3 of 3 positions shown; findings below may reference images not displayed]

FINDINGS: Oblique, Y scapular, and axillary images were obtained. There is no
fracture or dislocation. There is moderate generalized
osteoarthritic change. No erosive change. Visualized right lung is
clear.
IMPRESSION: Moderate generalized osteoarthritic change. No fracture or
dislocation.

## 2019-06-11 ENCOUNTER — Other Ambulatory Visit: Payer: Self-pay | Admitting: Family Medicine

## 2019-06-11 DIAGNOSIS — K219 Gastro-esophageal reflux disease without esophagitis: Secondary | ICD-10-CM

## 2019-06-26 ENCOUNTER — Other Ambulatory Visit: Payer: Self-pay | Admitting: Family Medicine

## 2019-06-26 DIAGNOSIS — J310 Chronic rhinitis: Secondary | ICD-10-CM

## 2019-07-23 ENCOUNTER — Other Ambulatory Visit: Payer: Self-pay

## 2019-07-23 ENCOUNTER — Ambulatory Visit: Payer: Medicare Other | Admitting: Family Medicine

## 2019-07-23 ENCOUNTER — Other Ambulatory Visit: Payer: Self-pay | Admitting: Family Medicine

## 2019-07-23 ENCOUNTER — Encounter: Payer: Self-pay | Admitting: Family Medicine

## 2019-07-23 VITALS — BP 130/76 | HR 66 | Temp 97.1°F | Resp 16 | Ht 72.0 in | Wt 204.8 lb

## 2019-07-23 DIAGNOSIS — E785 Hyperlipidemia, unspecified: Secondary | ICD-10-CM

## 2019-07-23 DIAGNOSIS — I10 Essential (primary) hypertension: Secondary | ICD-10-CM

## 2019-07-23 DIAGNOSIS — G250 Essential tremor: Secondary | ICD-10-CM

## 2019-07-23 DIAGNOSIS — R351 Nocturia: Secondary | ICD-10-CM | POA: Diagnosis not present

## 2019-07-23 DIAGNOSIS — Z23 Encounter for immunization: Secondary | ICD-10-CM

## 2019-07-23 DIAGNOSIS — D692 Other nonthrombocytopenic purpura: Secondary | ICD-10-CM

## 2019-07-23 DIAGNOSIS — I77811 Abdominal aortic ectasia: Secondary | ICD-10-CM | POA: Diagnosis not present

## 2019-07-23 DIAGNOSIS — E559 Vitamin D deficiency, unspecified: Secondary | ICD-10-CM

## 2019-07-23 DIAGNOSIS — K219 Gastro-esophageal reflux disease without esophagitis: Secondary | ICD-10-CM

## 2019-07-23 DIAGNOSIS — N183 Chronic kidney disease, stage 3 unspecified: Secondary | ICD-10-CM

## 2019-07-23 DIAGNOSIS — R739 Hyperglycemia, unspecified: Secondary | ICD-10-CM

## 2019-07-23 MED ORDER — OMEPRAZOLE 20 MG PO CPDR: 20.0000 mg | DELAYED_RELEASE_CAPSULE | Freq: Every day | ORAL | 1 refills | Status: DC

## 2019-07-23 MED ORDER — LOSARTAN POTASSIUM-HCTZ 50-12.5 MG PO TABS: 1.0000 | ORAL_TABLET | Freq: Every day | ORAL | 1 refills | Status: DC

## 2019-07-23 MED ORDER — ATORVASTATIN CALCIUM 40 MG PO TABS: 40.0000 mg | ORAL_TABLET | Freq: Every day | ORAL | 1 refills | Status: DC

## 2019-07-23 MED ORDER — AMLODIPINE BESYLATE 5 MG PO TABS: 5.0000 mg | ORAL_TABLET | Freq: Every day | ORAL | 1 refills | Status: DC

## 2019-07-23 NOTE — Progress Notes (Signed)
Name: Zachary Meyer   MRN: [id number]    DOB: 07-24-1941   Date:07/23/2019       Progress Note  Subjective  Chief Complaint  Chief Complaint  Patient presents with  . Medication Refill  . Hypertension    Denies any symptoms  . Dyslipidemia  . Hyperglycemia    HPI  HTN: He takes ARB, HCTZ and low dose Norvasc. He is on Propanolol but was added by neurologist for essential tremors. He denies chest pain, palpitation or SOB, he has noticed that he gets dizzy if he gets up from a squatting position or when bending forward occasionally. Discussed importance of staying hydrated, getting up slowly.   Dyslipidemia: he has low HDL, LDL still around 100, he is on high dose statin therapy . Lipitor 40 mg , he is eating fish a couple of times a week, also trying to walk when playing golf, between a couple of holes, still works in his yard. He is due for labs   CKI stage III: based on low GFR for past 3  years,  avoid nsaid's, he is drinking more water and avoiding sweet tea,  take tylenol prn for pain. We will recheck labs today   Hyperglycemia: no polyphagia, polydipsia or polyuria but has noticed urinary frequency.Last A1C 5.9% in August 2019 and we will recheck it today  Tremors: chronic, seen by neurologist, aggravated when he uses weed eater, otherwise stable, taking propanolol bid. Unchanged   Senile purpura: he has easy bruising and thin skin. Stable   AR: using nasal steroids at night when congestion is worse and is doing well on medications and needs refills   IPSS Questionnaire (AUA-7): Over the past month.   1)  How often have you had a sensation of not emptying your bladder completely after you finish urinating?  0 - Not at all  2)  How often have you had to urinate again less than two hours after you finished urinating? 1 - Less than 1 time in 5  3)  How often have you found you stopped and started again several times when you urinated?  0 - Not at all  4) How difficult  have you found it to postpone urination?  0 - Not at all  5) How often have you had a weak urinary stream?  0 - Not at all  6) How often have you had to push or strain to begin urination?  0 - Not at all  7) How many times did you most typically get up to urinate from the time you went to bed until the time you got up in the morning?  2 - 2 times  Total score:  0-7 mildly symptomatic   8-19 moderately symptomatic   20-35 severely symptomatic     Patient Active Problem List   Diagnosis Date Noted  . Personal history of colonic polyps   . Benign neoplasm of ascending colon   . Benign neoplasm of transverse colon   . Polyp of sigmoid colon   . Senile purpura ([place]) 07/21/2018  . Chronic kidney disease, stage III (moderate) ([place]) 07/21/2018  . Vitamin D deficiency 07/21/2018  . Hyperglycemia 01/23/2016  . Hypertension 07/25/2015  . GERD (gastroesophageal reflux disease) 07/25/2015  . Hyperlipidemia 07/25/2015  . Perennial allergic rhinitis 07/25/2015  . Benign essential tremor 07/25/2015    Past Surgical History:  Procedure Laterality Date  . COLONOSCOPY WITH PROPOFOL    . COLONOSCOPY WITH PROPOFOL N/A 10/13/2018   Procedure:  COLONOSCOPY WITH PROPOFOL;  Surgeon: Lucilla Lame, MD;  Location: [facility] ENDOSCOPY;  Service: Endoscopy;  Laterality: N/A;  . MELANOMA EXCISION Left 12/2012    Family History  Problem Relation Age of Onset  . Hypertension Sister   . Hypertension Brother   . Heart attack Mother     Social History   Socioeconomic History  . Marital status: Married    Spouse name: [name]   . Number of children: 2  . Years of education: Not on file  . Highest education level: 10th grade  Occupational History  . 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 Financial resource strain: Not hard at all  . Food insecurity    Worry: Never true    Inability: Never true  . Transportation needs    Medical: No    Non-medical:  No  Tobacco Use  . Smoking status: Former Smoker    Packs/day: 0.50    Years: 10.00    Pack years: 5.00    Types: Cigarettes    Start date: 11/25/1953    Quit date: 01/22/1964    Years since quitting: 55.5  . Smokeless tobacco: Never Used  Substance and Sexual Activity  . Alcohol use: No    Alcohol/week: 0.0 standard drinks  . Drug use: No  . Sexual activity: Not Currently  Lifestyle  . Physical activity    Days per week: 3 days    Minutes per session: 30 min  . Stress: Not at all  Relationships  . Social Herbalist on phone: Three times a week    Gets together: More than three times a week    Attends religious service: More than 4 times per year    Active member of club or organization: Yes    Attends meetings of clubs or organizations: More than 4 times per year    Relationship status: Married  . Intimate partner violence    Fear of current or ex partner: No    Emotionally abused: No    Physically abused: No    Forced sexual activity: No  Other Topics Concern  . Not on file  Social History Narrative   Married, independent. Retired    Building services engineer once a week    He has 3 grand-children and one great-grandchild      Current Outpatient Medications:  .  amLODipine (NORVASC) 5 MG tablet, Take 1 tablet (5 mg total) by mouth daily., Disp: 90 tablet, Rfl: 1 .  aspirin EC 81 MG tablet, Take 1 tablet (81 mg total) by mouth daily., Disp: 30 tablet, Rfl: 0 .  atorvastatin (LIPITOR) 40 MG tablet, Take 1 tablet (40 mg total) by mouth daily at 6 PM., Disp: 90 tablet, Rfl: 1 .  Cholecalciferol (VITAMIN D3) 50 MCG (2000 UT) TABS, Take 1 tablet by mouth daily., Disp: 30 tablet, Rfl: 0 .  fluticasone (FLONASE) 50 MCG/ACT nasal spray, USE 2 SPRAYS INTO EACH NOSTRIL AT BEDTIME, Disp: 48 mL, Rfl: 1 .  losartan-hydrochlorothiazide (HYZAAR) 50-12.5 MG tablet, Take 1 tablet by mouth daily., Disp: 90 tablet, Rfl: 1 .  omeprazole (PRILOSEC) 20 MG capsule, TAKE 1 CAPSULE BY MOUTH EVERY  DAY, Disp: 90 capsule, Rfl: 0 .  propranolol (INDERAL) 20 MG tablet, Take 1 tablet (20 mg total) by mouth 2 (two) times daily., Disp: 180 tablet, Rfl: 1  No Known Allergies  I personally reviewed active problem list, medication list, allergies, family  history, social history, health maintenance with the patient/caregiver today.   ROS  Constitutional: Negative for fever or weight change.  Respiratory: Negative for cough and shortness of breath.   Cardiovascular: Negative for chest pain or palpitations.  Gastrointestinal: Negative for abdominal pain, no bowel changes.  Musculoskeletal: Negative for gait problem or joint swelling.  Skin: Negative for rash.  Neurological: Negative for dizziness or headache.  No other specific complaints in a complete review of systems (except as listed in HPI above).  Objective  Vitals:   07/23/19 1013  BP: 130/76  Pulse: 66  Resp: 16  Temp: (!) 97.1 F (36.2 C)  TempSrc: Temporal  SpO2: 99%  Weight: 204 lb 12.8 oz (92.9 kg)  Height: 6' (1.829 m)    Body mass index is 27.78 kg/m.  Physical Exam  Constitutional: Patient appears well-developed and well-nourished. Overweight. No distress.  HEENT: head atraumatic, normocephalic, pupils equal and reactive to light,  neck supple Cardiovascular: Normal rate, regular rhythm and normal heart sounds.  No murmur heard. No BLE edema. Pulmonary/Chest: Effort normal and breath sounds normal. No respiratory distress. Abdominal: Soft.  There is no tenderness. Neurological: not tremors during rest  Psychiatric: Patient has a normal mood and affect. behavior is normal. Judgment and thought content normal.  PHQ2/9: Depression screen [facility] 2/9 07/23/2019 01/21/2019 09/22/2018 07/21/2018 10/20/2017  Decreased Interest 0 0 0 0 0  Down, Depressed, Hopeless 0 0 0 0 0  PHQ - 2 Score 0 0 0 0 0  Altered sleeping 0 0 0 - -  Tired, decreased energy 0 0 0 - -  Change in appetite 0 0 0 - -  Feeling bad or failure  about yourself  0 0 0 - -  Trouble concentrating 0 0 0 - -  Moving slowly or fidgety/restless 0 0 0 - -  Suicidal thoughts 0 0 0 - -  PHQ-9 Score 0 0 0 - -  Difficult doing work/chores Not difficult at all Not difficult at all Not difficult at all - -    phq 9 is negative   Fall Risk: Fall Risk  07/23/2019 01/21/2019 09/22/2018 07/21/2018 01/21/2018  Falls in the past year? 0 0 No No No  Number falls in past yr: 0 - - - -  Injury with Fall? 0 - - - -  Comment - - - - -  Follow up - - - - -    Functional Status Survey: Is the patient deaf or have difficulty hearing?: No Does the patient have difficulty seeing, even when wearing glasses/contacts?: Yes Does the patient have difficulty concentrating, remembering, or making decisions?: No Does the patient have difficulty walking or climbing stairs?: No Does the patient have difficulty dressing or bathing?: No Does the patient have difficulty doing errands alone such as visiting a doctor's office or shopping?: No    Assessment & Plan  1. Nocturia  Discussed life style modification, cut down on caffeine, avoid drinking fluids past 8 pm and monitor for now, low AUA   2. Dyslipidemia (high LDL; low HDL)  - atorvastatin (LIPITOR) 40 MG tablet; Take 1 tablet (40 mg total) by mouth daily at 6 PM.  Dispense: 90 tablet; Refill: 1  3. Ectatic abdominal aorta (HCC)  Repeat doppler in 2024   4. Essential hypertension  - amLODipine (NORVASC) 5 MG tablet; Take 1 tablet (5 mg total) by mouth daily.  Dispense: 90 tablet; Refill: 1 - losartan-hydrochlorothiazide (HYZAAR) 50-12.5 MG tablet; Take 1 tablet by mouth daily.  Dispense: 90 tablet; Refill: 1 - Comp panel - CBC   5. Benign essential tremor  stable  6. Senile purpura (HCC)  Stable   7. Chronic kidney disease, stage III (moderate) ([place])  Recheck labs  8. Vitamin D deficiency  -vitamin D   9. Needs flu shot  - Flu Vaccine QUAD High Dose(Fluad)  10. Need for Tdap  vaccination  Not given because of cost  11. Gastroesophageal reflux disease, esophagitis presence not specified  - omeprazole (PRILOSEC) 20 MG capsule; Take 1 capsule (20 mg total) by mouth daily.  Dispense: 90 capsule; Refill: 1

## 2019-07-23 NOTE — Telephone Encounter (Signed)
Pt was seen today. Pharmacy did not receive the propranolol prescription. Please send to [email or web address]. He will not have enough to last through the weekend

## 2019-07-24 LAB — COMPLETE METABOLIC PANEL WITH GFR
AG Ratio: 1.8 (calc) (ref 1.0–2.5)
ALT: 16 U/L (ref 9–46)
AST: 14 U/L (ref 10–35)
Albumin: 4.7 g/dL (ref 3.6–5.1)
Alkaline phosphatase (APISO): 85 U/L (ref 35–144)
BUN/Creatinine Ratio: 14 (calc) (ref 6–22)
BUN: 18 mg/dL (ref 7–25)
CO2: 31 mmol/L (ref 20–32)
Calcium: 9.7 mg/dL (ref 8.6–10.3)
Chloride: 104 mmol/L (ref 98–110)
Creat: 1.27 mg/dL — ABNORMAL HIGH (ref 0.70–1.18)
GFR, Est African American: 64 mL/min/{1.73_m2} (ref >=60)
GFR, Est Non African American: 55 mL/min/{1.73_m2} — ABNORMAL LOW (ref >=60)
Globulin: 2.6 g/dL (calc) (ref 1.9–3.7)
Glucose, Bld: 114 mg/dL — ABNORMAL HIGH (ref 65–99)
Potassium: 4.6 mmol/L (ref 3.5–5.3)
Sodium: 141 mmol/L (ref 135–146)
Total Bilirubin: 0.8 mg/dL (ref 0.2–1.2)
Total Protein: 7.3 g/dL (ref 6.1–8.1)

## 2019-07-24 LAB — CBC WITH DIFFERENTIAL/PLATELET
Absolute Monocytes: 460 cells/uL (ref 200–950)
Basophils Absolute: 19 cells/uL (ref 0–200)
Basophils Relative: 0.3 %
Eosinophils Absolute: 277 cells/uL (ref 15–500)
Eosinophils Relative: 4.4 %
HCT: 44.3 % (ref 38.5–50.0)
Hemoglobin: 15 g/dL (ref 13.2–17.1)
Lymphs Abs: 1084 cells/uL (ref 850–3900)
MCH: 31.8 pg (ref 27.0–33.0)
MCHC: 33.9 g/dL (ref 32.0–36.0)
MCV: 93.9 fL (ref 80.0–100.0)
MPV: 10.3 fL (ref 7.5–12.5)
Monocytes Relative: 7.3 %
Neutro Abs: 4460 cells/uL (ref 1500–7800)
Neutrophils Relative %: 70.8 %
Platelets: 261 10*3/uL (ref 140–400)
RBC: 4.72 10*6/uL (ref 4.20–5.80)
RDW: 11.9 % (ref 11.0–15.0)
Total Lymphocyte: 17.2 %
WBC: 6.3 10*3/uL (ref 3.8–10.8)

## 2019-07-24 LAB — LIPID PANEL
Cholesterol: 150 mg/dL (ref <200)
HDL: 33 mg/dL — ABNORMAL LOW (ref >=40)
LDL Cholesterol (Calc): 95 mg/dL (calc)
Non-HDL Cholesterol (Calc): 117 mg/dL (calc) (ref <130)
Total CHOL/HDL Ratio: 4.5 (calc) (ref <5.0)
Triglycerides: 119 mg/dL (ref <150)

## 2019-07-24 LAB — HEMOGLOBIN A1C
Hgb A1c MFr Bld: 5.9 % of total Hgb — ABNORMAL HIGH (ref <5.7)
Mean Plasma Glucose: 123 (calc)
eAG (mmol/L): 6.8 (calc)

## 2019-07-24 LAB — VITAMIN D 25 HYDROXY (VIT D DEFICIENCY, FRACTURES): Vit D, 25-Hydroxy: 43 ng/mL (ref 30–100)

## 2019-07-25 MED ORDER — PROPRANOLOL HCL 20 MG PO TABS: 20.0000 mg | ORAL_TABLET | Freq: Two times a day (BID) | ORAL | 1 refills | Status: DC

## 2019-09-24 ENCOUNTER — Ambulatory Visit: Payer: Medicare Other

## 2019-10-15 ENCOUNTER — Ambulatory Visit (INDEPENDENT_AMBULATORY_CARE_PROVIDER_SITE_OTHER): Payer: Medicare Other

## 2019-10-15 ENCOUNTER — Other Ambulatory Visit: Payer: Self-pay

## 2019-10-15 VITALS — BP 130/78 | Ht 72.0 in | Wt 206.0 lb

## 2019-10-15 DIAGNOSIS — Z0000 Encounter for general adult medical examination without abnormal findings: Secondary | ICD-10-CM | POA: Diagnosis not present

## 2019-10-15 NOTE — Progress Notes (Signed)
Subjective:   Zachary Meyer is a 78 y.o. male who presents for Medicare Annual/Subsequent preventive examination.  Virtual Visit via Telephone Note  I connected with Zachary Meyer on 10/15/19 at 10:00 AM EST by telephone and verified that I am speaking with the correct person using two identifiers.  Medicare Annual Wellness visit completed telephonically due to Covid-19 pandemic.  Location: Patient: home Provider: office   I discussed the limitations, risks, security and privacy concerns of performing an evaluation and management service by telephone and the availability of in person appointments. The patient expressed understanding and agreed to proceed.  Some vital signs may be absent or patient reported.   Clemetine Marker, LPN    Review of Systems:   Cardiac Risk Factors include: advanced age (>82men, >25 women);male gender;hypertension;dyslipidemia     Objective:    Vitals: BP 130/78   Ht 6' (1.829 m)   Wt 206 lb (93.4 kg)   BMI 27.94 kg/m   Body mass index is 27.94 kg/m.  Advanced Directives 10/15/2019 10/13/2018 09/19/2017 08/05/2017 07/18/2017 04/18/2017 01/20/2017  Does Patient Have a Medical Advance Directive? Yes Yes Yes Yes Yes No No  Type of Paramedic of [place];Living will - [place];Living will - - - -  Does patient want to make changes to medical advance directive? - - - - - - -  Copy of [place] in Chart? No - copy requested - No - copy requested - - - -    Tobacco Social History   Tobacco Use  Smoking Status Former Smoker  . Packs/day: 0.50  . Years: 10.00  . Pack years: 5.00  . Types: Cigarettes  . Start date: 11/25/1953  . Quit date: 01/22/1964  . Years since quitting: 55.7  Smokeless Tobacco Never Used     Counseling given: Not Answered   Clinical Intake:  Pre-visit preparation completed: Yes  Pain : No/denies pain     BMI - recorded: 27.94 Nutritional Status: BMI 25  -29 Overweight Nutritional Risks: None Diabetes: No  How often do you need to have someone help you when you read instructions, pamphlets, or other written materials from your doctor or pharmacy?: 1 - Never  Interpreter Needed?: No  Information entered by :: Clemetine Marker LPN  Past Medical History:  Diagnosis Date  . Cancer (HCC)    Skin Cancer ([place])  . Chronic kidney disease, stage III (moderate)   . GERD (gastroesophageal reflux disease)   . Hyperlipidemia   . Hypertension    Past Surgical History:  Procedure Laterality Date  . COLONOSCOPY WITH PROPOFOL    . COLONOSCOPY WITH PROPOFOL N/A 10/13/2018   Procedure: COLONOSCOPY WITH PROPOFOL;  Surgeon: Lucilla Lame, MD;  Location: [facility] ENDOSCOPY;  Service: Endoscopy;  Laterality: N/A;  . MELANOMA EXCISION Left 12/2012   Family History  Problem Relation Age of Onset  . Hypertension Sister   . Hypertension Brother   . Heart attack Mother    Social History   Socioeconomic History  . Marital status: Married    Spouse name: Zachary Meyer   . Number of children: 2  . Years of education: Not on file  . Highest education level: 10th grade  Occupational History  . 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 Financial resource strain: Not hard at all  . Food insecurity    Worry: Never true    Inability: Never  true  . Transportation needs    Medical: No    Non-medical: No  Tobacco Use  . Smoking status: Former Smoker    Packs/day: 0.50    Years: 10.00    Pack years: 5.00    Types: Cigarettes    Start date: 11/25/1953    Quit date: 01/22/1964    Years since quitting: 55.7  . Smokeless tobacco: Never Used  Substance and Sexual Activity  . Alcohol use: No    Alcohol/week: 0.0 standard drinks  . Drug use: No  . Sexual activity: Not Currently    Birth control/protection: None  Lifestyle  . Physical activity    Days per week: 3 days    Minutes per session: 30 min  . Stress:  Not at all  Relationships  . Social Herbalist on phone: Three times a week    Gets together: More than three times a week    Attends religious service: More than 4 times per year    Active member of club or organization: Yes    Attends meetings of clubs or organizations: More than 4 times per year    Relationship status: Married  Other Topics Concern  . Not on file  Social History Narrative   Married, independent. Retired    Building services engineer once a week    He has 3 grand-children and one great-grandchild     Outpatient Encounter Medications as of 10/15/2019  Medication Sig  . amLODipine (NORVASC) 5 MG tablet Take 1 tablet (5 mg total) by mouth daily.  Marland Kitchen aspirin EC 81 MG tablet Take 1 tablet (81 mg total) by mouth daily.  Marland Kitchen atorvastatin (LIPITOR) 40 MG tablet Take 1 tablet (40 mg total) by mouth daily at 6 PM.  . Cholecalciferol (VITAMIN D3) 50 MCG (2000 UT) TABS Take 1 tablet by mouth daily.  . fluticasone (FLONASE) 50 MCG/ACT nasal spray USE 2 SPRAYS INTO EACH NOSTRIL AT BEDTIME  . losartan-hydrochlorothiazide (HYZAAR) 50-12.5 MG tablet Take 1 tablet by mouth daily.  Marland Kitchen omeprazole (PRILOSEC) 20 MG capsule Take 1 capsule (20 mg total) by mouth daily.  . propranolol (INDERAL) 20 MG tablet Take 1 tablet (20 mg total) by mouth 2 (two) times daily.   No facility-administered encounter medications on file as of 10/15/2019.     Activities of Daily Living In your present state of health, do you have any difficulty performing the following activities: 10/15/2019 07/23/2019  Hearing? N N  Comment declines hearing aids -  Vision? N Y  Difficulty concentrating or making decisions? N N  Walking or climbing stairs? N N  Dressing or bathing? N N  Doing errands, shopping? N N  Preparing Food and eating ? N -  Using the Toilet? N -  In the past six months, have you accidently leaked urine? N -  Do you have problems with loss of bowel control? N -  Managing your Medications? N -   Managing your Finances? N -  Housekeeping or managing your Housekeeping? N -  Some recent data might be hidden    Patient Care Team: Steele Sizer, MD as PCP - General (Family Medicine) Dasher, Rayvon Char, MD as Consulting Physician (Dermatology) Vladimir Crofts, MD as Consulting Physician (Neurology)   Assessment:   This is a routine wellness examination for Zachary Meyer.  Exercise Activities and Dietary recommendations Current Exercise Habits: Home exercise routine, Type of exercise: walking;Other - see comments(yard work, golf), Time (Minutes): 30, Frequency (Times/Week): 7, Weekly Exercise (  Minutes/Week): 210, Intensity: Moderate, Exercise limited by: None identified  Goals    . Reduce portion size     Recommend to reduce portion size of meals to 3 small healthy meals per day and 2 healthy snacks per day       Fall Risk Fall Risk  10/15/2019 07/23/2019 01/21/2019 09/22/2018 07/21/2018  Falls in the past year? 0 0 0 No No  Number falls in past yr: 0 0 - - -  Injury with Fall? 0 0 - - -  Comment - - - - -  Follow up Falls prevention discussed - - - -   FALL RISK PREVENTION PERTAINING TO THE HOME:  Any stairs in or around the home? Yes  If so, do they handrails? Yes   Home free of loose throw rugs in walkways, pet beds, electrical cords, etc? Yes  Adequate lighting in your home to reduce risk of falls? Yes   ASSISTIVE DEVICES UTILIZED TO PREVENT FALLS:  Life alert? No  Use of a cane, walker or w/c? No  Grab bars in the bathroom? No  Shower chair or bench in shower? No  Elevated toilet seat or a handicapped toilet? No   DME ORDERS:  DME order needed?  No   TIMED UP AND GO:  Was the test performed? No . Telephonic visit.   Education: Fall risk prevention has been discussed.  Intervention(s) required? No    Depression Screen PHQ 2/9 Scores 10/15/2019 07/23/2019 01/21/2019 09/22/2018  PHQ - 2 Score 0 0 0 0  PHQ- 9 Score - 0 0 0    Cognitive Function     6CIT  Screen 10/15/2019 09/22/2018 09/19/2017  What Year? 0 points 0 points 0 points  What month? 0 points 0 points -  What time? 0 points 0 points 0 points  Count back from 20 0 points 0 points 0 points  Months in reverse 0 points 0 points 0 points  Repeat phrase 0 points 2 points 4 points  Total Score 0 2 -    Immunization History  Administered Date(s) Administered  . Fluad Quad(high Dose 65+) 07/23/2019  . Influenza, High Dose Seasonal PF 08/27/2016, 09/08/2017, 09/22/2018  . Pneumococcal Conjugate-13 01/21/2018  . Pneumococcal Polysaccharide-23 08/27/2016  . Zoster 10/24/2016    Qualifies for Shingles Vaccine? Yes  Zostavax completed 2017. Due for Shingrix. Education has been provided regarding the importance of this vaccine. Pt has been advised to call insurance company to determine out of pocket expense. Advised may also receive vaccine at local pharmacy or Health Dept. Verbalized acceptance and understanding.  Tdap: Although this vaccine is not a covered service during a Wellness Exam, does the patient still wish to receive this vaccine today?  No .  Education has been provided regarding the importance of this vaccine. Advised may receive this vaccine at local pharmacy or Health Dept. Aware to provide a copy of the vaccination record if obtained from local pharmacy or Health Dept. Verbalized acceptance and understanding.  Flu Vaccine: Up to date  Pneumococcal Vaccine: Up to date   Screening Tests Health Maintenance  Topic Date Due  . TETANUS/TDAP  11/05/1962  . COLONOSCOPY  10/13/2021  . INFLUENZA VACCINE  Completed  . PNA vac Low Risk Adult  Completed   Cancer Screenings:  Colorectal Screening: Completed 10/13/18. Repeat every 5 years  Lung Cancer Screening: (Low Dose CT Chest recommended if Age 62-80 years, 30 pack-year currently smoking OR have quit w/in 15years.) does not qualify.  Additional Screening:  Hepatitis C Screening: no longer required  Vision  Screening: Recommended annual ophthalmology exams for early detection of glaucoma and other disorders of the eye. Is the patient up to date with their annual eye exam?  Yes  Who is the provider or what is the name of the office in which the pt attends annual eye exams? Dr. Ellin Mayhew  Dental Screening: Recommended annual dental exams for proper oral hygiene  Community Resource Referral:  CRR required this visit?  No       Plan:    I have personally reviewed and addressed the Medicare Annual Wellness questionnaire and have noted the following in the patient's chart:  A. Medical and social history B. Use of alcohol, tobacco or illicit drugs  C. Current medications and supplements D. Functional ability and status E.  Nutritional status F.  Physical activity G. Advance directives H. List of other physicians I.  Hospitalizations, surgeries, and ER visits in previous 12 months J.  [place] such as hearing and vision if needed, cognitive and depression L. Referrals and appointments   In addition, I have reviewed and discussed with patient certain preventive protocols, quality metrics, and best practice recommendations. A written personalized care plan for preventive services as well as general preventive health recommendations were provided to patient.   Signed,  Clemetine Marker, LPN Nurse Health Advisor   Nurse Notes: pt doing well and appreciative of visit today.

## 2019-10-15 NOTE — Patient Instructions (Signed)
Mr. Zachary Meyer , Thank you for taking time to come for your Medicare Wellness Visit. I appreciate your ongoing commitment to your health goals. Please review the following plan we discussed and let me know if I can assist you in the future.   Screening recommendations/referrals: Colonoscopy: done 10/13/18. Repeat in 2024. Recommended yearly ophthalmology/optometry visit for glaucoma screening and checkup Recommended yearly dental visit for hygiene and checkup  Vaccinations: Influenza vaccine: done 07/23/19 Pneumococcal vaccine: done 01/21/18 Tdap vaccine: due Shingles vaccine: Shingrix discussed. Please contact your pharmacy for coverage information.   Advanced directives: Please bring a copy of your health care power of attorney and living will to the office at your convenience.  Conditions/risks identified: Keep up the great work!  Next appointment: Please follow up in one year for your Medicare Annual Wellness visit.    Preventive Care 76 Years and Older, Male Preventive care refers to lifestyle choices and visits with your health care provider that can promote health and wellness. What does preventive care include?  A yearly physical exam. This is also called an annual well check.  Dental exams once or twice a year.  Routine eye exams. Ask your health care provider how often you should have your eyes checked.  Personal lifestyle choices, including:  Daily care of your teeth and gums.  Regular physical activity.  Eating a healthy diet.  Avoiding tobacco and drug use.  Limiting alcohol use.  Practicing safe sex.  Taking low doses of aspirin every day.  Taking vitamin and mineral supplements as recommended by your health care provider. What happens during an annual well check? The services and screenings done by your health care provider during your annual well check will depend on your age, overall health, lifestyle risk factors, and family history of disease. Counseling   Your health care provider may ask you questions about your:  Alcohol use.  Tobacco use.  Drug use.  Emotional well-being.  Home and relationship well-being.  Sexual activity.  Eating habits.  History of falls.  Memory and ability to understand (cognition).  Work and work Statistician. Screening  You may have the following tests or measurements:  Height, weight, and BMI.  Blood pressure.  Lipid and cholesterol levels. These may be checked every 5 years, or more frequently if you are over 39 years old.  Skin check.  Lung cancer screening. You may have this screening every year starting at age 38 if you have a 30-pack-year history of smoking and currently smoke or have quit within the past 15 years.  Fecal occult blood test (FOBT) of the stool. You may have this test every year starting at age 30.  Flexible sigmoidoscopy or colonoscopy. You may have a sigmoidoscopy every 5 years or a colonoscopy every 10 years starting at age 28.  Prostate cancer screening. Recommendations will vary depending on your family history and other risks.  Hepatitis C blood test.  Hepatitis B blood test.  Sexually transmitted disease (STD) testing.  Diabetes screening. This is done by checking your blood sugar (glucose) after you have not eaten for a while (fasting). You may have this done every 1-3 years.  Abdominal aortic aneurysm (AAA) screening. You may need this if you are a current or former smoker.  Osteoporosis. You may be screened starting at age 11 if you are at high risk. Talk with your health care provider about your test results, treatment options, and if necessary, the need for more tests. Vaccines  Your health care provider may  recommend certain vaccines, such as:  Influenza vaccine. This is recommended every year.  Tetanus, diphtheria, and acellular pertussis (Tdap, Td) vaccine. You may need a Td booster every 10 years.  Zoster vaccine. You may need this after age 37.   Pneumococcal 13-valent conjugate (PCV13) vaccine. One dose is recommended after age 53.  Pneumococcal polysaccharide (PPSV23) vaccine. One dose is recommended after age 31. Talk to your health care provider about which screenings and vaccines you need and how often you need them. This information is not intended to replace advice given to you by your health care provider. Make sure you discuss any questions you have with your health care provider. Document Released: 12/08/2015 Document Revised: 07/31/2016 Document Reviewed: 09/12/2015 Elsevier Interactive Patient Education  2017 [place] Prevention in the Home Falls can cause injuries. They can happen to people of all ages. There are many things you can do to make your home safe and to help prevent falls. What can I do on the outside of my home?  Regularly fix the edges of walkways and driveways and fix any cracks.  Remove anything that might make you trip as you walk through a door, such as a raised step or threshold.  Trim any bushes or trees on the path to your home.  Use bright outdoor lighting.  Clear any walking paths of anything that might make someone trip, such as rocks or tools.  Regularly check to see if handrails are loose or broken. Make sure that both sides of any steps have handrails.  Any raised decks and porches should have guardrails on the edges.  Have any leaves, snow, or ice cleared regularly.  Use sand or salt on walking paths during winter.  Clean up any spills in your garage right away. This includes oil or grease spills. What can I do in the bathroom?  Use night lights.  Install grab bars by the toilet and in the tub and shower. Do not use towel bars as grab bars.  Use non-skid mats or decals in the tub or shower.  If you need to sit down in the shower, use a plastic, non-slip stool.  Keep the floor dry. Clean up any water that spills on the floor as soon as it happens.  Remove soap  buildup in the tub or shower regularly.  Attach bath mats securely with double-sided non-slip rug tape.  Do not have throw rugs and other things on the floor that can make you trip. What can I do in the bedroom?  Use night lights.  Make sure that you have a light by your bed that is easy to reach.  Do not use any sheets or blankets that are too big for your bed. They should not hang down onto the floor.  Have a firm chair that has side arms. You can use this for support while you get dressed.  Do not have throw rugs and other things on the floor that can make you trip. What can I do in the kitchen?  Clean up any spills right away.  Avoid walking on wet floors.  Keep items that you use a lot in easy-to-reach places.  If you need to reach something above you, use a strong step stool that has a grab bar.  Keep electrical cords out of the way.  Do not use floor polish or wax that makes floors slippery. If you must use wax, use non-skid floor wax.  Do not have throw rugs and  other things on the floor that can make you trip. What can I do with my stairs?  Do not leave any items on the stairs.  Make sure that there are handrails on both sides of the stairs and use them. Fix handrails that are broken or loose. Make sure that handrails are as long as the stairways.  Check any carpeting to make sure that it is firmly attached to the stairs. Fix any carpet that is loose or worn.  Avoid having throw rugs at the top or bottom of the stairs. If you do have throw rugs, attach them to the floor with carpet tape.  Make sure that you have a light switch at the top of the stairs and the bottom of the stairs. If you do not have them, ask someone to add them for you. What else can I do to help prevent falls?  Wear shoes that:  Do not have high heels.  Have rubber bottoms.  Are comfortable and fit you well.  Are closed at the toe. Do not wear sandals.  If you use a stepladder:  Make  sure that it is fully opened. Do not climb a closed stepladder.  Make sure that both sides of the stepladder are locked into place.  Ask someone to hold it for you, if possible.  Clearly mark and make sure that you can see:  Any grab bars or handrails.  First and last steps.  Where the edge of each step is.  Use tools that help you move around (mobility aids) if they are needed. These include:  Canes.  Walkers.  Scooters.  Crutches.  Turn on the lights when you go into a dark area. Replace any light bulbs as soon as they burn out.  Set up your furniture so you have a clear path. Avoid moving your furniture around.  If any of your floors are uneven, fix them.  If there are any pets around you, be aware of where they are.  Review your medicines with your doctor. Some medicines can make you feel dizzy. This can increase your chance of falling. Ask your doctor what other things that you can do to help prevent falls. This information is not intended to replace advice given to you by your health care provider. Make sure you discuss any questions you have with your health care provider. Document Released: 09/07/2009 Document Revised: 04/18/2016 Document Reviewed: 12/16/2014 Elsevier Interactive Patient Education  2017 [employer].

## 2019-11-11 ENCOUNTER — Ambulatory Visit: Payer: Self-pay

## 2019-11-11 NOTE — Telephone Encounter (Signed)
Incoming call from Patient reporting that he has become dizzy lately with an onset of yesterday. Reports that when he bends over to tie his shoe he becomes dizzy.  Rated mild to moderate. Denies aggravating factors.  Blood Pressure with heart rate of 76,  150/90.  Denies any other symptoms.  Reviewed protocol with Patient.  Schedule appointment for Monday 11/15/19 @ 1:45pm . Voiced  Understanding.               Reason for Disposition . [1] MODERATE dizziness (e.g., interferes with normal activities) AND [2] has been evaluated by physician for this  Answer Assessment - Initial Assessment Questions 1. DESCRIPTION: "Describe your dizziness."     Every time I bend over. 2. LIGHTHEADED: "Do you feel lightheaded?" (e.g., somewhat faint, woozy, weak upon standing)     Not this 3. VERTIGO: "Do you feel like either you or the room is spinning or tilting?" (i.e. vertigo)     *No Answer* 4. SEVERITY: "How bad is it?"  "Do you feel like you are going to faint?" "Can you stand and walk?"   - MILD - walking normally   - MODERATE - interferes with normal activities (e.g., work, school)    - SEVERE - unable to stand, requires support to walk, feels like passing out now.      Mild to moderate 5. ONSET:  "When did the dizziness begin?"     yesterday 6. AGGRAVATING FACTORS: "Does anything make it worse?" (e.g., standing, change in head position)     denies 7. HEART RATE: "Can you tell me your heart rate?" "How many beats in 15 seconds?"  (Note: not all patients can do this)      Blood pressure this morning. 150/90 heart rate.  76 8. CAUSE: "What do you think is causing the dizziness?"     *No Answer* 9. RECURRENT SYMPTOM: "Have you had dizziness before?" If so, ask: "When was the last time?" "What happened that time?"     Today and yesterday 10. OTHER SYMPTOMS: "Do you have any other symptoms?" (e.g., fever, chest pain, vomiting, diarrhea, bleeding)      denies 11. PREGNANCY: "Is there any chance  you are pregnant?" "When was your last menstrual period?"      na  Protocols used: DIZZINESS [facility]

## 2019-11-15 ENCOUNTER — Other Ambulatory Visit: Payer: Self-pay

## 2019-11-15 ENCOUNTER — Encounter: Payer: Self-pay | Admitting: Family Medicine

## 2019-11-15 ENCOUNTER — Ambulatory Visit (INDEPENDENT_AMBULATORY_CARE_PROVIDER_SITE_OTHER): Payer: Medicare Other | Admitting: Family Medicine

## 2019-11-15 VITALS — BP 128/74 | HR 74 | Temp 97.3°F | Resp 16 | Ht 72.0 in | Wt 206.7 lb

## 2019-11-15 DIAGNOSIS — R42 Dizziness and giddiness: Secondary | ICD-10-CM | POA: Diagnosis not present

## 2019-11-15 NOTE — Patient Instructions (Signed)
Benign Positional Vertigo Vertigo is the feeling that you or your surroundings are moving when they are not. Benign positional vertigo is the most common form of vertigo. This is usually a harmless condition (benign). This condition is positional. This means that symptoms are triggered by certain movements and positions. This condition can be dangerous if it occurs while you are doing something that could cause harm to you or others. This includes activities such as driving or operating machinery. What are the causes? In many cases, the cause of this condition is not known. It may be caused by a disturbance in an area of the inner ear that helps your brain to sense movement and balance. This disturbance can be caused by:  Viral infection (labyrinthitis).  Head injury.  Repetitive motion, such as jumping, dancing, or running. What increases the risk? You are more likely to develop this condition if:  You are a woman.  You are 50 years of age or older. What are the signs or symptoms? Symptoms of this condition usually happen when you move your head or your eyes in different directions. Symptoms may start suddenly, and usually last for less than a minute. They include:  Loss of balance and falling.  Feeling like you are spinning or moving.  Feeling like your surroundings are spinning or moving.  Nausea and vomiting.  Blurred vision.  Dizziness.  Involuntary eye movement (nystagmus). Symptoms can be mild and cause only minor problems, or they can be severe and interfere with daily life. Episodes of benign positional vertigo may return (recur) over time. Symptoms may improve over time. How is this diagnosed? This condition may be diagnosed based on:  Your medical history.  Physical exam of the head, neck, and ears.  Tests, such as: ? MRI. ? CT scan. ? Eye movement tests. Your health care provider may ask you to change positions quickly while he or she watches you for symptoms  of benign positional vertigo, such as nystagmus. Eye movement may be tested with a variety of exams that are designed to evaluate or stimulate vertigo. ? An electroencephalogram (EEG). This records electrical activity in your brain. ? Hearing tests. You may be referred to a health care provider who specializes in ear, nose, and throat (ENT) problems (otolaryngologist) or a provider who specializes in disorders of the nervous system (neurologist). How is this treated?  This condition may be treated in a session in which your health care provider moves your head in specific positions to adjust your inner ear back to normal. Treatment for this condition may take several sessions. Surgery may be needed in severe cases, but this is rare. In some cases, benign positional vertigo may resolve on its own in 2-4 weeks. Follow these instructions at home: Safety  Move slowly. Avoid sudden body or head movements or certain positions, as told by your health care provider.  Avoid driving until your health care provider says it is safe for you to do so.  Avoid operating heavy machinery until your health care provider says it is safe for you to do so.  Avoid doing any tasks that would be dangerous to you or others if vertigo occurs.  If you have trouble walking or keeping your balance, try using a cane for stability. If you feel dizzy or unstable, sit down right away.  Return to your normal activities as told by your health care provider. Ask your health care provider what activities are safe for you. General instructions  Take over-the-counter   and prescription medicines only as told by your health care provider.  Drink enough fluid to keep your urine pale yellow.  Keep all follow-up visits as told by your health care provider. This is important. Contact a health care provider if:  You have a fever.  Your condition gets worse or you develop new symptoms.  Your family or friends notice any  behavioral changes.  You have nausea or vomiting that gets worse.  You have numbness or a "pins and needles" sensation. Get help right away if you:  Have difficulty speaking or moving.  Are always dizzy.  Faint.  Develop severe headaches.  Have weakness in your legs or arms.  Have changes in your hearing or vision.  Develop a stiff neck.  Develop sensitivity to light. Summary  Vertigo is the feeling that you or your surroundings are moving when they are not. Benign positional vertigo is the most common form of vertigo.  The cause of this condition is not known. It may be caused by a disturbance in an area of the inner ear that helps your brain to sense movement and balance.  Symptoms include loss of balance and falling, feeling that you or your surroundings are moving, nausea and vomiting, and blurred vision.  This condition can be diagnosed based on symptoms, physical exam, and other tests, such as MRI, CT scan, eye movement tests, and hearing tests.  Follow safety instructions as told by your health care provider. You will also be told when to contact your health care provider in case of problems. This information is not intended to replace advice given to you by your health care provider. Make sure you discuss any questions you have with your health care provider. Document Released: 08/19/2006 Document Revised: 04/22/2018 Document Reviewed: 04/22/2018 Elsevier Patient Education  2020 [employer]. How to Perform the Epley Maneuver The Epley maneuver is an exercise that relieves symptoms of vertigo. Vertigo is the feeling that you or your surroundings are moving when they are not. When you feel vertigo, you may feel like the room is spinning and have trouble walking. Dizziness is a little different than vertigo. When you are dizzy, you may feel unsteady or light-headed. You can do this maneuver at home whenever you have symptoms of vertigo. You can do it up to 3 times a day  until your symptoms go away. Even though the Epley maneuver may relieve your vertigo for a few weeks, it is possible that your symptoms will return. This maneuver relieves vertigo, but it does not relieve dizziness. What are the risks? If it is done correctly, the Epley maneuver is considered safe. Sometimes it can lead to dizziness or nausea that goes away after a short time. If you develop other symptoms, such as changes in vision, weakness, or numbness, stop doing the maneuver and call your health care provider. How to perform the Epley maneuver 1. Sit on the edge of a bed or table with your back straight and your legs extended or hanging over the edge of the bed or table. 2. Turn your head halfway toward the affected ear or side. 3. Lie backward quickly with your head turned until you are lying flat on your back. You may want to position a pillow under your shoulders. 4. Hold this position for 30 seconds. You may experience an attack of vertigo. This is normal. 5. Turn your head to the opposite direction until your unaffected ear is facing the floor. 6. Hold this position for 30  seconds. You may experience an attack of vertigo. This is normal. Hold this position until the vertigo stops. 7. Turn your whole body to the same side as your head. Hold for another 30 seconds. 8. Sit back up. You can repeat this exercise up to 3 times a day. Follow these instructions at home:  After doing the Epley maneuver, you can return to your normal activities.  Ask your health care provider if there is anything you should do at home to prevent vertigo. He or she may recommend that you: ? Keep your head raised (elevated) with two or more pillows while you sleep. ? Do not sleep on the side of your affected ear. ? Get up slowly from bed. ? Avoid sudden movements during the day. ? Avoid extreme head movement, like looking up or bending over. Contact a health care provider if:  Your vertigo gets worse.  You  have other symptoms, including: ? Nausea. ? Vomiting. ? Headache. Get help right away if:  You have vision changes.  You have a severe or worsening headache or neck pain.  You cannot stop vomiting.  You have new numbness or weakness in any part of your body. Summary  Vertigo is the feeling that you or your surroundings are moving when they are not.  The Epley maneuver is an exercise that relieves symptoms of vertigo.  If the Epley maneuver is done correctly, it is considered safe. You can do it up to 3 times a day. This information is not intended to replace advice given to you by your health care provider. Make sure you discuss any questions you have with your health care provider. Document Released: 11/16/2013 Document Revised: 10/24/2017 Document Reviewed: 10/01/2016 Elsevier Patient Education  2020 [employer].

## 2019-11-15 NOTE — Progress Notes (Signed)
Name: Zachary Meyer   MRN: [id number]    DOB: July 05, 1941   Date:11/15/2019       Progress Note  Subjective  Chief Complaint  Chief Complaint  Patient presents with  . Dizziness    Onset 11/10/2019 upon rising out of bed or standing back up from bending over.  Symptoms stopped on 11/13/2019    HPI  Vertigo, symptoms started on 12/16. He noticed sensation of movement while shopping and having to reach to lower shelves. He states after that had symptoms when getting out of bed, like his eyes were moving, also once walked to the left when going to the bathroom and one episode when he turned to his left side while in bed. He denies associated nausea, vomiting, fever, weakness, tinnitus or hearing loss. He states symptoms resolved two days ago. His bp was elevated after episodes usually 130's and it was in the 150 range. He is feeling well today   Patient Active Problem List   Diagnosis Date Noted  . Personal history of colonic polyps   . Benign neoplasm of ascending colon   . Benign neoplasm of transverse colon   . Polyp of sigmoid colon   . Senile purpura ([place]) 07/21/2018  . Chronic kidney disease, stage III (moderate) 07/21/2018  . Vitamin D deficiency 07/21/2018  . Hyperglycemia 01/23/2016  . Hypertension 07/25/2015  . GERD (gastroesophageal reflux disease) 07/25/2015  . Hyperlipidemia 07/25/2015  . Perennial allergic rhinitis 07/25/2015  . Benign essential tremor 07/25/2015    Past Surgical History:  Procedure Laterality Date  . COLONOSCOPY WITH PROPOFOL    . COLONOSCOPY WITH PROPOFOL N/A 10/13/2018   Procedure: COLONOSCOPY WITH PROPOFOL;  Surgeon: Lucilla Lame, MD;  [facility];  Service: Endoscopy;  Laterality: N/A;  . MELANOMA EXCISION Left 12/2012    Family History  Problem Relation Age of Onset  . Hypertension Sister   . Hypertension Brother   . Heart attack Mother     Social History   Socioeconomic History  . Marital status: Married    Spouse  name: [name]   . Number of children: 2  . Years of education: Not on file  . Highest education level: 10th grade  Occupational History  . Occupation: driver     Comment: road crew for [employer]   Tobacco Use  . Smoking status: Former Smoker    Packs/day: 0.50    Years: 10.00    Pack years: 5.00    Types: Cigarettes    Start date: 11/25/1953    Quit date: 01/22/1964    Years since quitting: 55.8  . Smokeless tobacco: Never Used  Substance and Sexual Activity  . Alcohol use: No    Alcohol/week: 0.0 standard drinks  . Drug use: No  . Sexual activity: Not Currently    Birth control/protection: None  Other Topics Concern  . Not on file  Social History Narrative   Married, independent. Retired    Building services engineer once a week    He has 3 grand-children and one great-grandchild    Social Determinants of Radio broadcast assistant Strain:   . Difficulty of Paying Living Expenses: Not on file  Food Insecurity:   . Worried About Charity fundraiser in the Last Year: Not on file  . Ran Out of Food in the Last Year: Not on file  Transportation Needs:   . Lack of Transportation (Medical): Not on file  . Lack of Transportation (Non-Medical): Not on file  Physical Activity:   . Days of Exercise per Week: Not on file  . Minutes of Exercise per Session: Not on file  Stress:   . Feeling of Stress : Not on file  Social Connections:   . Frequency of Communication with Friends and Family: Not on file  . Frequency of Social Gatherings with Friends and Family: Not on file  . Attends Religious Services: Not on file  . Active Member of Clubs or Organizations: Not on file  . Attends Archivist Meetings: Not on file  . Marital Status: Not on file  Intimate Partner Violence:   . Fear of Current or Ex-Partner: Not on file  . Emotionally Abused: Not on file  . Physically Abused: Not on file  . Sexually Abused: Not on file     Current Outpatient Medications:  .   amLODipine (NORVASC) 5 MG tablet, Take 1 tablet (5 mg total) by mouth daily., Disp: 90 tablet, Rfl: 1 .  aspirin EC 81 MG tablet, Take 1 tablet (81 mg total) by mouth daily., Disp: 30 tablet, Rfl: 0 .  atorvastatin (LIPITOR) 40 MG tablet, Take 1 tablet (40 mg total) by mouth daily at 6 PM., Disp: 90 tablet, Rfl: 1 .  Cholecalciferol (VITAMIN D3) 50 MCG (2000 UT) TABS, Take 1 tablet by mouth daily., Disp: 30 tablet, Rfl: 0 .  fluticasone (FLONASE) 50 MCG/ACT nasal spray, USE 2 SPRAYS INTO EACH NOSTRIL AT BEDTIME, Disp: 48 mL, Rfl: 1 .  losartan-hydrochlorothiazide (HYZAAR) 50-12.5 MG tablet, Take 1 tablet by mouth daily., Disp: 90 tablet, Rfl: 1 .  omeprazole (PRILOSEC) 20 MG capsule, Take 1 capsule (20 mg total) by mouth daily., Disp: 90 capsule, Rfl: 1 .  propranolol (INDERAL) 20 MG tablet, Take 1 tablet (20 mg total) by mouth 2 (two) times daily., Disp: 180 tablet, Rfl: 1  No Known Allergies  I personally reviewed active problem list, medication list, allergies, family history, social history with the patient/caregiver today.   ROS  Constitutional: Negative for fever or weight change.  Respiratory: Negative for cough and shortness of breath.   Cardiovascular: Negative for chest pain or palpitations.  Gastrointestinal: Negative for abdominal pain, no bowel changes.  Musculoskeletal: Negative for gait problem or joint swelling.  Skin: Negative for rash.  Neurological: Negative for dizziness ( resolved )  or headache.  No other specific complaints in a complete review of systems (except as listed in HPI above).  Objective  Vitals:   11/15/19 1405  BP: 128/74  Pulse: 74  Resp: 16  Temp: (!) 97.3 F (36.3 C)  SpO2: 95%  Weight: 206 lb 11.2 oz (93.8 kg)  Height: 6' (1.829 m)    Body mass index is 28.03 kg/m.  Physical Exam  Constitutional: Patient appears well-developed and well-nourished. Overweight.   No distress.  HEENT: head atraumatic, normocephalic, pupils equal and  reactive to light, ears normal TM bilateral , neck supple, throat within normal limits Cardiovascular: Normal rate, regular rhythm and normal heart sounds.  No murmur heard. No BLE edema. Pulmonary/Chest: Effort normal and breath sounds normal. No respiratory distress. Abdominal: Soft.  There is no tenderness. Neurological: normal cranial nerves, normal strength, stable tremors right hand, no nystagmus, Romberg negative Psychiatric: Patient has a normal mood and affect. behavior is normal. Judgment and thought content normal.  PHQ2/9: Depression screen [facility] 2/9 11/15/2019 10/15/2019 07/23/2019 01/21/2019 09/22/2018  Decreased Interest 0 0 0 0 0  Down, Depressed, Hopeless 0 0 0 0 0  PHQ - 2  Score 0 0 0 0 0  Altered sleeping 0 - 0 0 0  Tired, decreased energy 0 - 0 0 0  Change in appetite 0 - 0 0 0  Feeling bad or failure about yourself  0 - 0 0 0  Trouble concentrating 0 - 0 0 0  Moving slowly or fidgety/restless 0 - 0 0 0  Suicidal thoughts - - 0 0 0  PHQ-9 Score 0 - 0 0 0  Difficult doing work/chores Not difficult at all - Not difficult at all Not difficult at all Not difficult at all    phq 9 is negative   Fall Risk: Fall Risk  11/15/2019 10/15/2019 07/23/2019 01/21/2019 09/22/2018  Falls in the past year? 0 0 0 0 No  Number falls in past yr: 0 0 0 - -  Injury with Fall? 0 0 0 - -  Comment - - - - -  Follow up - Falls prevention discussed - - -     Functional Status Survey: Is the patient deaf or have difficulty hearing?: No Does the patient have difficulty seeing, even when wearing glasses/contacts?: No Does the patient have difficulty concentrating, remembering, or making decisions?: No Does the patient have difficulty walking or climbing stairs?: No Does the patient have difficulty dressing or bathing?: No Does the patient have difficulty doing errands alone such as visiting a doctor's office or shopping?: No    Assessment & Plan  1. Vertigo  Advised patient to call  back for referral to ENT if recurrence, giving him information about BPPV. Also to stay hydrated. His bp was high and not low when checked, less likely orthostatic changes  2. Dizziness

## 2020-01-11 ENCOUNTER — Other Ambulatory Visit: Payer: Self-pay | Admitting: Family Medicine

## 2020-01-11 DIAGNOSIS — I10 Essential (primary) hypertension: Secondary | ICD-10-CM

## 2020-01-17 DIAGNOSIS — D225 Melanocytic nevi of trunk: Secondary | ICD-10-CM | POA: Diagnosis not present

## 2020-01-17 DIAGNOSIS — D2262 Melanocytic nevi of left upper limb, including shoulder: Secondary | ICD-10-CM | POA: Diagnosis not present

## 2020-01-17 DIAGNOSIS — Z85828 Personal history of other malignant neoplasm of skin: Secondary | ICD-10-CM | POA: Diagnosis not present

## 2020-01-17 DIAGNOSIS — D2261 Melanocytic nevi of right upper limb, including shoulder: Secondary | ICD-10-CM | POA: Diagnosis not present

## 2020-01-17 DIAGNOSIS — L821 Other seborrheic keratosis: Secondary | ICD-10-CM | POA: Diagnosis not present

## 2020-01-17 DIAGNOSIS — D2272 Melanocytic nevi of left lower limb, including hip: Secondary | ICD-10-CM | POA: Diagnosis not present

## 2020-01-17 DIAGNOSIS — L570 Actinic keratosis: Secondary | ICD-10-CM | POA: Diagnosis not present

## 2020-01-17 DIAGNOSIS — X32XXXA Exposure to sunlight, initial encounter: Secondary | ICD-10-CM | POA: Diagnosis not present

## 2020-01-17 DIAGNOSIS — D2271 Melanocytic nevi of right lower limb, including hip: Secondary | ICD-10-CM | POA: Diagnosis not present

## 2020-01-25 ENCOUNTER — Encounter: Payer: Self-pay | Admitting: Family Medicine

## 2020-01-25 ENCOUNTER — Other Ambulatory Visit: Payer: Self-pay

## 2020-01-25 ENCOUNTER — Ambulatory Visit: Payer: Medicare Other | Admitting: Family Medicine

## 2020-01-25 VITALS — BP 120/80 | HR 81 | Temp 97.7°F | Resp 16 | Ht 72.0 in | Wt 209.5 lb

## 2020-01-25 DIAGNOSIS — N1831 Chronic kidney disease, stage 3a: Secondary | ICD-10-CM | POA: Diagnosis not present

## 2020-01-25 DIAGNOSIS — I77811 Abdominal aortic ectasia: Secondary | ICD-10-CM

## 2020-01-25 DIAGNOSIS — I10 Essential (primary) hypertension: Secondary | ICD-10-CM

## 2020-01-25 DIAGNOSIS — D692 Other nonthrombocytopenic purpura: Secondary | ICD-10-CM | POA: Diagnosis not present

## 2020-01-25 DIAGNOSIS — K219 Gastro-esophageal reflux disease without esophagitis: Secondary | ICD-10-CM | POA: Diagnosis not present

## 2020-01-25 DIAGNOSIS — E559 Vitamin D deficiency, unspecified: Secondary | ICD-10-CM | POA: Diagnosis not present

## 2020-01-25 DIAGNOSIS — J310 Chronic rhinitis: Secondary | ICD-10-CM

## 2020-01-25 DIAGNOSIS — E785 Hyperlipidemia, unspecified: Secondary | ICD-10-CM

## 2020-01-25 DIAGNOSIS — G250 Essential tremor: Secondary | ICD-10-CM

## 2020-01-25 MED ORDER — LOSARTAN POTASSIUM-HCTZ 50-12.5 MG PO TABS: 1.0000 | ORAL_TABLET | Freq: Every day | ORAL | 1 refills | Status: DC

## 2020-01-25 MED ORDER — OMEPRAZOLE 20 MG PO CPDR: 20.0000 mg | DELAYED_RELEASE_CAPSULE | Freq: Every day | ORAL | 1 refills | Status: DC

## 2020-01-25 MED ORDER — PROPRANOLOL HCL 20 MG PO TABS: 20.0000 mg | ORAL_TABLET | Freq: Two times a day (BID) | ORAL | 1 refills | Status: DC

## 2020-01-25 MED ORDER — FLUTICASONE PROPIONATE 50 MCG/ACT NA SUSP: 2.0000 | Freq: Every day | NASAL | 1 refills | Status: DC

## 2020-01-25 MED ORDER — AMLODIPINE BESYLATE 5 MG PO TABS: 5.0000 mg | ORAL_TABLET | Freq: Every day | ORAL | 0 refills | Status: DC

## 2020-01-25 MED ORDER — ATORVASTATIN CALCIUM 40 MG PO TABS: 40.0000 mg | ORAL_TABLET | Freq: Every day | ORAL | 1 refills | Status: DC

## 2020-01-25 NOTE — Progress Notes (Signed)
Name: Zachary Meyer   MRN: [id number]    DOB: 11-24-1941   Date:01/25/2020       Progress Note  Subjective  Chief Complaint  Chief Complaint  Patient presents with  . Hypertension  . Hyperlipidemia  . Hyperglycemia  . Gastroesophageal Reflux    HPI  HTN: He takes ARB, HCTZ and low dose Norvasc. He is on Propanolol but was added by neurologist for essential tremors. He denies chest pain, palpitation or SOB, he denies any dizziness or orthostatic changes. Discussed importance of staying hydrated, getting up slowly.   Dyslipidemia: he has low HDL, LDL still around 100, on high dose Atorvastatin -  40 mg , he is eating fish a couple of times a week, also trying to walk when playing golf, between a couple of holes - but not playing lately because of the weather, but trying to take care of his yard   CKI stage III: based on low GFR for past3years, avoid nsaid's, he is drinking more water and avoiding sweet tea,take tylenol prn for pain. Reviewed last labs   Hyperglycemia: no polyphagia, polydipsia or polyuria but has noticed urinary frequency.Last A1C 5.9% back in 07/23/2019   Tremors: chronic, seen by neurologist, aggravated when he uses weed eater, otherwise stable, taking propanolol bidand is doing well   Senile purpura: he has easy bruising and thin skin. On left hand today   AR: using nasal steroids at night when congestion is worse and is doing well on medications and needs refills   Episode of vertigo 10/2020 but resolved by itself   Patient Active Problem List   Diagnosis Date Noted  . Personal history of colonic polyps   . Benign neoplasm of ascending colon   . Benign neoplasm of transverse colon   . Polyp of sigmoid colon   . Senile purpura ([place]) 07/21/2018  . Chronic kidney disease, stage III (moderate) 07/21/2018  . Vitamin D deficiency 07/21/2018  . Hyperglycemia 01/23/2016  . Hypertension 07/25/2015  . GERD (gastroesophageal reflux disease) 07/25/2015   . Hyperlipidemia 07/25/2015  . Perennial allergic rhinitis 07/25/2015  . Benign essential tremor 07/25/2015    Past Surgical History:  Procedure Laterality Date  . COLONOSCOPY WITH PROPOFOL    . COLONOSCOPY WITH PROPOFOL N/A 10/13/2018   Procedure: COLONOSCOPY WITH PROPOFOL;  Surgeon: Lucilla Lame, MD;  Location: [facility] ENDOSCOPY;  Service: Endoscopy;  Laterality: N/A;  . MELANOMA EXCISION Left 12/2012    Family History  Problem Relation Age of Onset  . Hypertension Sister   . Hypertension Brother   . Heart attack Mother     Social History   Tobacco Use  . Smoking status: Former Smoker    Packs/day: 0.50    Years: 10.00    Pack years: 5.00    Types: Cigarettes    Start date: 11/25/1953    Quit date: 01/22/1964    Years since quitting: 56.0  . Smokeless tobacco: Never Used  Substance Use Topics  . Alcohol use: No    Alcohol/week: 0.0 standard drinks     Current Outpatient Medications:  .  amLODipine (NORVASC) 5 MG tablet, TAKE 1 TABLET BY MOUTH EVERY DAY, Disp: 90 tablet, Rfl: 1 .  aspirin EC 81 MG tablet, Take 1 tablet (81 mg total) by mouth daily., Disp: 30 tablet, Rfl: 0 .  atorvastatin (LIPITOR) 40 MG tablet, Take 1 tablet (40 mg total) by mouth daily at 6 PM., Disp: 90 tablet, Rfl: 1 .  Cholecalciferol (VITAMIN D3) 50 MCG (2000  UT) TABS, Take 1 tablet by mouth daily., Disp: 30 tablet, Rfl: 0 .  fluticasone (FLONASE) 50 MCG/ACT nasal spray, USE 2 SPRAYS INTO EACH NOSTRIL AT BEDTIME, Disp: 48 mL, Rfl: 1 .  losartan-hydrochlorothiazide (HYZAAR) 50-12.5 MG tablet, Take 1 tablet by mouth daily., Disp: 90 tablet, Rfl: 1 .  omeprazole (PRILOSEC) 20 MG capsule, Take 1 capsule (20 mg total) by mouth daily., Disp: 90 capsule, Rfl: 1 .  propranolol (INDERAL) 20 MG tablet, Take 1 tablet (20 mg total) by mouth 2 (two) times daily., Disp: 180 tablet, Rfl: 1  No Known Allergies  I personally reviewed active problem list, medication list, allergies, family history, social history,  health maintenance with the patient/caregiver today.   ROS  Constitutional: Negative for fever or weight change.  Respiratory: Negative for cough and shortness of breath.   Cardiovascular: Negative for chest pain or palpitations.  Gastrointestinal: Negative for abdominal pain, no bowel changes.  Musculoskeletal: Negative for gait problem or joint swelling.  Skin: Negative for rash.  Neurological: Negative for dizziness or headache.  No other specific complaints in a complete review of systems (except as listed in HPI above).  Objective  Vitals:   01/25/20 1028 01/25/20 1040  BP: (!) 150/84 120/80  Pulse: 81   Resp: 16   Temp: 97.7 F (36.5 C)   TempSrc: Temporal   SpO2: 94%   Weight: 209 lb 8 oz (95 kg)   Height: 6' (1.829 m)     Body mass index is 28.41 kg/m.  Physical Exam  Constitutional: Patient appears well-developed and well-nourished. Overweight.  No distress.  HEENT: head atraumatic, normocephalic, pupils equal and reactive to light Cardiovascular: Normal rate, regular rhythm and normal heart sounds.  No murmur heard. No BLE edema. Pulmonary/Chest: Effort normal and breath sounds normal. No respiratory distress. Abdominal: Soft.  There is no tenderness. Psychiatric: Patient has a normal mood and affect. behavior is normal. Judgment and thought content normal.  PHQ2/9: Depression screen [facility] 2/9 01/25/2020 11/15/2019 10/15/2019 07/23/2019 01/21/2019  Decreased Interest 0 0 0 0 0  Down, Depressed, Hopeless 0 0 0 0 0  PHQ - 2 Score 0 0 0 0 0  Altered sleeping 0 0 - 0 0  Tired, decreased energy 0 0 - 0 0  Change in appetite 0 0 - 0 0  Feeling bad or failure about yourself  0 0 - 0 0  Trouble concentrating 0 0 - 0 0  Moving slowly or fidgety/restless 0 0 - 0 0  Suicidal thoughts 0 - - 0 0  PHQ-9 Score 0 0 - 0 0  Difficult doing work/chores - Not difficult at all - Not difficult at all Not difficult at all  Some recent data might be hidden    phq 9 is  negative   Fall Risk: Fall Risk  01/25/2020 11/15/2019 10/15/2019 07/23/2019 01/21/2019  Falls in the past year? 0 0 0 0 0  Number falls in past yr: 0 0 0 0 -  Injury with Fall? 0 0 0 0 -  Comment - - - - -  Follow up - - Falls prevention discussed - -     Functional Status Survey: Is the patient deaf or have difficulty hearing?: No Does the patient have difficulty seeing, even when wearing glasses/contacts?: No Does the patient have difficulty concentrating, remembering, or making decisions?: No Does the patient have difficulty walking or climbing stairs?: No Does the patient have difficulty dressing or bathing?: No Does the patient have difficulty  doing errands alone such as visiting a doctor's office or shopping?: No    Assessment & Plan  1. Dyslipidemia (high LDL; low HDL)  - atorvastatin (LIPITOR) 40 MG tablet; Take 1 tablet (40 mg total) by mouth daily at 6 PM.  Dispense: 90 tablet; Refill: 1  2. Ectatic abdominal aorta ([facility])  On statin and aspirin daily   3. Senile purpura ([facility])  stable  4. Benign essential tremor  - propranolol (INDERAL) 20 MG tablet; Take 1 tablet (20 mg total) by mouth 2 (two) times daily.  Dispense: 180 tablet; Refill: 1  5. Essential hypertension  - losartan-hydrochlorothiazide (HYZAAR) 50-12.5 MG tablet; Take 1 tablet by mouth daily.  Dispense: 90 tablet; Refill: 1 - amLODipine (NORVASC) 5 MG tablet; Take 1 tablet (5 mg total) by mouth daily.  Dispense: 90 tablet; Refill: 0  6. Stage 3a chronic kidney disease   7. Vitamin D deficiency  Continue supplementation   8. Chronic rhinitis  - fluticasone (FLONASE) 50 MCG/ACT nasal spray; Place 2 sprays into both nostrils daily.  Dispense: 48 mL; Refill: 1  9. GERD without esophagitis  - omeprazole (PRILOSEC) 20 MG capsule; Take 1 capsule (20 mg total) by mouth daily.  Dispense: 90 capsule; Refill: 1

## 2020-04-27 ENCOUNTER — Other Ambulatory Visit: Payer: Self-pay | Admitting: Family Medicine

## 2020-04-27 DIAGNOSIS — E785 Hyperlipidemia, unspecified: Secondary | ICD-10-CM

## 2020-04-27 DIAGNOSIS — I10 Essential (primary) hypertension: Secondary | ICD-10-CM

## 2020-06-27 DIAGNOSIS — H2513 Age-related nuclear cataract, bilateral: Secondary | ICD-10-CM | POA: Diagnosis not present

## 2020-07-14 ENCOUNTER — Other Ambulatory Visit: Payer: Self-pay | Admitting: Family Medicine

## 2020-07-14 DIAGNOSIS — K219 Gastro-esophageal reflux disease without esophagitis: Secondary | ICD-10-CM

## 2020-07-17 DIAGNOSIS — D2271 Melanocytic nevi of right lower limb, including hip: Secondary | ICD-10-CM | POA: Diagnosis not present

## 2020-07-17 DIAGNOSIS — X32XXXA Exposure to sunlight, initial encounter: Secondary | ICD-10-CM | POA: Diagnosis not present

## 2020-07-17 DIAGNOSIS — D225 Melanocytic nevi of trunk: Secondary | ICD-10-CM | POA: Diagnosis not present

## 2020-07-17 DIAGNOSIS — D2272 Melanocytic nevi of left lower limb, including hip: Secondary | ICD-10-CM | POA: Diagnosis not present

## 2020-07-17 DIAGNOSIS — D2261 Melanocytic nevi of right upper limb, including shoulder: Secondary | ICD-10-CM | POA: Diagnosis not present

## 2020-07-17 DIAGNOSIS — D2262 Melanocytic nevi of left upper limb, including shoulder: Secondary | ICD-10-CM | POA: Diagnosis not present

## 2020-07-17 DIAGNOSIS — L570 Actinic keratosis: Secondary | ICD-10-CM | POA: Diagnosis not present

## 2020-07-17 DIAGNOSIS — D0462 Carcinoma in situ of skin of left upper limb, including shoulder: Secondary | ICD-10-CM | POA: Diagnosis not present

## 2020-07-17 DIAGNOSIS — D485 Neoplasm of uncertain behavior of skin: Secondary | ICD-10-CM | POA: Diagnosis not present

## 2020-07-17 DIAGNOSIS — C44519 Basal cell carcinoma of skin of other part of trunk: Secondary | ICD-10-CM | POA: Diagnosis not present

## 2020-07-17 DIAGNOSIS — Z85828 Personal history of other malignant neoplasm of skin: Secondary | ICD-10-CM | POA: Diagnosis not present

## 2020-07-20 ENCOUNTER — Other Ambulatory Visit: Payer: Self-pay | Admitting: Family Medicine

## 2020-07-20 DIAGNOSIS — J310 Chronic rhinitis: Secondary | ICD-10-CM

## 2020-07-20 NOTE — Telephone Encounter (Signed)
Requested Prescriptions  Pending Prescriptions Disp Refills   fluticasone (FLONASE) 50 MCG/ACT nasal spray [Pharmacy Med Name: FLUTICASONE PROP 50 MCG SPRAY] 48 mL 1    Sig: SPRAY 2 SPRAYS INTO EACH NOSTRIL EVERY DAY     Ear, Nose, and Throat: Nasal Preparations - Corticosteroids Passed - 07/20/2020  1:09 AM      Passed - Valid encounter within last 12 months    Recent Outpatient Visits          5 months ago Dyslipidemia (high LDL; low HDL)   [facility] Steele Sizer, MD   8 months ago Vertigo   [facility] Steele Sizer, MD   12 months ago Ectatic abdominal aorta [facility])   [facility] Steele Sizer, MD   1 year ago Ectatic abdominal aorta [facility])   [facility] Steele Sizer, MD   2 years ago Essential hypertension   [facility] Steele Sizer, MD      Future Appointments            In 1 week Steele Sizer, MD [facility] [place]   In 3 months  [facility], [facility]

## 2020-07-27 NOTE — Progress Notes (Signed)
Name: Zachary Meyer   MRN: [id number]    DOB: 1941/04/28   Date:07/28/2020       Progress Note  Subjective  Chief Complaint  Chief Complaint  Patient presents with  . Hypertension  . Gastroesophageal Reflux  . Hypothyroidism  . Hyperglycemia    HPI  HTN: He takes ARB, HCTZ and low dose Norvasc. He is on Propanolol but was added by neurologist for essential tremors. He denies chest pain, palpitation or SOB,he denies any dizziness or orthostatic changes. BP today was elevated, it has also been in the 150's at home, we will adjust dose of losartan hctz from 50/12.5 to 100/25, he will double his dose of losartan and recheck in one week to see if not too low. We may need to change to 100/12.5 if it drops too much with 100/25   Dyslipidemia: he has low HDL, last LDL was 95 , on high dose Atorvastatin -  40 mg, he is eating fish a couple of times a week, also trying to walk when playing golf. We will recheck labs today. No myalgia  CKI stage III: based on low GFR for past4 years, avoid nsaid's, he is drinking more water and avoiding sweet tea. He only takes Tylenol for pain prn. We will recheck labs   Hyperglycemia: no polyphagia, polydipsia or polyuriabut he has urinary frequency but only when at home, able to play golf without problems .Last A1C 5.9% back in 07/23/2019 , discussed a low carbohydrate diet   Tremors: chronic, seen by neurologist, aggravated when he uses weed eater, otherwise stable, taking propanolol bidand is stable    Senile purpura: he has easy bruising and thin skin, reassurance given   AR: using nasal steroids at night when congestion is worse and is doing wellon medications . Unchanged    Episode of vertigo 10/2020 but resolved by itself   Aorta Ectasy: he is on statin therapy and baby aspirin, repeat AAA in 2024  Patient Active Problem List   Diagnosis Date Noted  . Personal history of colonic polyps   . Benign neoplasm of ascending colon   .  Benign neoplasm of transverse colon   . Polyp of sigmoid colon   . Senile purpura ([place]) 07/21/2018  . Chronic kidney disease, stage III (moderate) 07/21/2018  . Vitamin D deficiency 07/21/2018  . Hyperglycemia 01/23/2016  . Hypertension 07/25/2015  . GERD (gastroesophageal reflux disease) 07/25/2015  . Hyperlipidemia 07/25/2015  . Perennial allergic rhinitis 07/25/2015  . Benign essential tremor 07/25/2015    Past Surgical History:  Procedure Laterality Date  . COLONOSCOPY WITH PROPOFOL    . COLONOSCOPY WITH PROPOFOL N/A 10/13/2018   Procedure: COLONOSCOPY WITH PROPOFOL;  Surgeon: Lucilla Lame, MD;  Location: [facility] ENDOSCOPY;  Service: Endoscopy;  Laterality: N/A;  . MELANOMA EXCISION Left 12/2012    Family History  Problem Relation Age of Onset  . Hypertension Sister   . Hypertension Brother   . Heart attack Mother     Social History   Tobacco Use  . Smoking status: Former Smoker    Packs/day: 0.50    Years: 10.00    Pack years: 5.00    Types: Cigarettes    Start date: 11/25/1953    Quit date: 01/22/1964    Years since quitting: 56.5  . Smokeless tobacco: Never Used  Substance Use Topics  . Alcohol use: No    Alcohol/week: 0.0 standard drinks     Current Outpatient Medications:  .  amLODipine (NORVASC) 5 MG tablet,  Take 1 tablet (5 mg total) by mouth daily., Disp: 90 tablet, Rfl: 0 .  aspirin EC 81 MG tablet, Take 1 tablet (81 mg total) by mouth daily., Disp: 30 tablet, Rfl: 0 .  atorvastatin (LIPITOR) 40 MG tablet, Take 1 tablet (40 mg total) by mouth daily at 6 PM., Disp: 90 tablet, Rfl: 0 .  Cholecalciferol (VITAMIN D3) 50 MCG (2000 UT) TABS, Take 1 tablet by mouth daily., Disp: 30 tablet, Rfl: 0 .  fluticasone (FLONASE) 50 MCG/ACT nasal spray, SPRAY 2 SPRAYS INTO EACH NOSTRIL EVERY DAY, Disp: 48 mL, Rfl: 2 .  losartan-hydrochlorothiazide (HYZAAR) 50-12.5 MG tablet, Take 2 tablets by mouth daily., Disp: 60 tablet, Rfl: 0 .  omeprazole (PRILOSEC) 20 MG capsule, TAKE  1 CAPSULE BY MOUTH EVERY DAY, Disp: 90 capsule, Rfl: 1 .  propranolol (INDERAL) 20 MG tablet, Take 1 tablet (20 mg total) by mouth 2 (two) times daily., Disp: 180 tablet, Rfl: 1  No Known Allergies  I personally reviewed active problem list, medication list, allergies, family history, social history, health maintenance with the patient/caregiver today.   ROS  Constitutional: Negative for fever or weight change.  Respiratory: Negative for cough and shortness of breath.   Cardiovascular: Negative for chest pain or palpitations.  Gastrointestinal: Negative for abdominal pain, no bowel changes.  Musculoskeletal: Negative for gait problem or joint swelling.  Skin: Negative for rash.  Neurological: Negative for dizziness or headache.  No other specific complaints in a complete review of systems (except as listed in HPI above).  Objective  Vitals:   07/28/20 1006 07/28/20 1019  BP: (!) 150/76 (!) 150/80  Pulse: 74   Resp: 16   Temp: 97.8 F (36.6 C)   TempSrc: Oral   SpO2: 99%   Weight: 206 lb 1.6 oz (93.5 kg)   Height: 6' (1.829 m)     Body mass index is 27.95 kg/m.  Physical Exam  Constitutional: Patient appears well-developed and well-nourished. No distress.  HEENT: head atraumatic, normocephalic, pupils equal and reactive to light,neck supple Cardiovascular: Normal rate, regular rhythm and normal heart sounds.  No murmur heard. No BLE edema. Pulmonary/Chest: Effort normal and breath sounds normal. No respiratory distress. Abdominal: Soft.  There is no tenderness. Skin: senile purpura  Psychiatric: Patient has a normal mood and affect. behavior is normal. Judgment and thought content normal.  PHQ2/9: Depression screen [facility] 2/9 07/28/2020 01/25/2020 11/15/2019 10/15/2019 07/23/2019  Decreased Interest 0 0 0 0 0  Down, Depressed, Hopeless 0 0 0 0 0  PHQ - 2 Score 0 0 0 0 0  Altered sleeping - 0 0 - 0  Tired, decreased energy - 0 0 - 0  Change in appetite - 0 0 - 0  Feeling  bad or failure about yourself  - 0 0 - 0  Trouble concentrating - 0 0 - 0  Moving slowly or fidgety/restless - 0 0 - 0  Suicidal thoughts - 0 - - 0  PHQ-9 Score - 0 0 - 0  Difficult doing work/chores - - Not difficult at all - Not difficult at all  Some recent data might be hidden    phq 9 is negative   Fall Risk: Fall Risk  07/28/2020 01/25/2020 11/15/2019 10/15/2019 07/23/2019  Falls in the past year? 0 0 0 0 0  Number falls in past yr: 0 0 0 0 0  Injury with Fall? 0 0 0 0 0  Comment - - - - -  Follow up - - -  Falls prevention discussed -     Assessment & Plan  1. Ectatic abdominal aorta ([place])   2. Need for hepatitis C screening test  - Hepatitis C antibody  3. Dyslipidemia (high LDL; low HDL)  - Lipid panel - atorvastatin (LIPITOR) 40 MG tablet; Take 1 tablet (40 mg total) by mouth daily at 6 PM.  Dispense: 90 tablet; Refill: 0  4. Senile purpura (HCC)  Stable   5. Stage 3a chronic kidney disease  - CBC with Differential/Platelet - COMPLETE METABOLIC PANEL WITH GFR  6. Essential hypertension  - losartan-hydrochlorothiazide (HYZAAR) 50-12.5 MG tablet; Take 2 tablets by mouth daily.  Dispense: 60 tablet; Refill: 0 - amLODipine (NORVASC) 5 MG tablet; Take 1 tablet (5 mg total) by mouth daily.  Dispense: 90 tablet; Refill: 0  7. Benign essential tremor  - CBC with Differential/Platelet - COMPLETE METABOLIC PANEL WITH GFR  8. Vitamin D deficiency  - VITAMIN D 25 Hydroxy (Vit-D Deficiency, Fractures)  9. GERD without esophagitis  Controlled   10. Hyperglycemia  - Hemoglobin A1c  11. Need for immunization against influenza  - Flu Vaccine QUAD High Dose(Fluad)

## 2020-07-28 ENCOUNTER — Other Ambulatory Visit: Payer: Self-pay

## 2020-07-28 ENCOUNTER — Encounter: Payer: Self-pay | Admitting: Family Medicine

## 2020-07-28 ENCOUNTER — Ambulatory Visit: Payer: Medicare PPO | Admitting: Family Medicine

## 2020-07-28 VITALS — BP 150/80 | HR 74 | Temp 97.8°F | Resp 16 | Ht 72.0 in | Wt 206.1 lb

## 2020-07-28 DIAGNOSIS — G250 Essential tremor: Secondary | ICD-10-CM

## 2020-07-28 DIAGNOSIS — I10 Essential (primary) hypertension: Secondary | ICD-10-CM | POA: Diagnosis not present

## 2020-07-28 DIAGNOSIS — R739 Hyperglycemia, unspecified: Secondary | ICD-10-CM | POA: Diagnosis not present

## 2020-07-28 DIAGNOSIS — Z23 Encounter for immunization: Secondary | ICD-10-CM

## 2020-07-28 DIAGNOSIS — I77811 Abdominal aortic ectasia: Secondary | ICD-10-CM

## 2020-07-28 DIAGNOSIS — D692 Other nonthrombocytopenic purpura: Secondary | ICD-10-CM

## 2020-07-28 DIAGNOSIS — N1831 Chronic kidney disease, stage 3a: Secondary | ICD-10-CM

## 2020-07-28 DIAGNOSIS — E559 Vitamin D deficiency, unspecified: Secondary | ICD-10-CM | POA: Diagnosis not present

## 2020-07-28 DIAGNOSIS — Z1159 Encounter for screening for other viral diseases: Secondary | ICD-10-CM

## 2020-07-28 DIAGNOSIS — E785 Hyperlipidemia, unspecified: Secondary | ICD-10-CM

## 2020-07-28 DIAGNOSIS — K219 Gastro-esophageal reflux disease without esophagitis: Secondary | ICD-10-CM

## 2020-07-28 MED ORDER — AMLODIPINE BESYLATE 5 MG PO TABS: 5.0000 mg | ORAL_TABLET | Freq: Every day | ORAL | 0 refills | Status: DC

## 2020-07-28 MED ORDER — LOSARTAN POTASSIUM-HCTZ 50-12.5 MG PO TABS: 2.0000 | ORAL_TABLET | Freq: Every day | ORAL | 0 refills | Status: DC

## 2020-07-28 MED ORDER — ATORVASTATIN CALCIUM 40 MG PO TABS: 40.0000 mg | ORAL_TABLET | Freq: Every day | ORAL | 0 refills | Status: DC

## 2020-08-01 LAB — COMPLETE METABOLIC PANEL WITH GFR
AG Ratio: 1.8 (calc) (ref 1.0–2.5)
ALT: 17 U/L (ref 9–46)
AST: 17 U/L (ref 10–35)
Albumin: 4.6 g/dL (ref 3.6–5.1)
Alkaline phosphatase (APISO): 87 U/L (ref 35–144)
BUN/Creatinine Ratio: 16 (calc) (ref 6–22)
BUN: 21 mg/dL (ref 7–25)
CO2: 27 mmol/L (ref 20–32)
Calcium: 9.9 mg/dL (ref 8.6–10.3)
Chloride: 104 mmol/L (ref 98–110)
Creat: 1.34 mg/dL — ABNORMAL HIGH (ref 0.70–1.18)
GFR, Est African American: 59 mL/min/{1.73_m2} — ABNORMAL LOW (ref >=60)
GFR, Est Non African American: 51 mL/min/{1.73_m2} — ABNORMAL LOW (ref >=60)
Globulin: 2.5 g/dL (calc) (ref 1.9–3.7)
Glucose, Bld: 93 mg/dL (ref 65–99)
Potassium: 4.5 mmol/L (ref 3.5–5.3)
Sodium: 141 mmol/L (ref 135–146)
Total Bilirubin: 0.6 mg/dL (ref 0.2–1.2)
Total Protein: 7.1 g/dL (ref 6.1–8.1)

## 2020-08-01 LAB — LIPID PANEL
Cholesterol: 164 mg/dL (ref <200)
HDL: 37 mg/dL — ABNORMAL LOW (ref >=40)
LDL Cholesterol (Calc): 107 mg/dL (calc) — ABNORMAL HIGH
Non-HDL Cholesterol (Calc): 127 mg/dL (calc) (ref <130)
Total CHOL/HDL Ratio: 4.4 (calc) (ref <5.0)
Triglycerides: 103 mg/dL (ref <150)

## 2020-08-01 LAB — CBC WITH DIFFERENTIAL/PLATELET
Absolute Monocytes: 561 cells/uL (ref 200–950)
Basophils Absolute: 47 cells/uL (ref 0–200)
Basophils Relative: 0.6 %
Eosinophils Absolute: 277 cells/uL (ref 15–500)
Eosinophils Relative: 3.5 %
HCT: 44.2 % (ref 38.5–50.0)
Hemoglobin: 14.9 g/dL (ref 13.2–17.1)
Lymphs Abs: 1288 cells/uL (ref 850–3900)
MCH: 31.4 pg (ref 27.0–33.0)
MCHC: 33.7 g/dL (ref 32.0–36.0)
MCV: 93.2 fL (ref 80.0–100.0)
MPV: 10.2 fL (ref 7.5–12.5)
Monocytes Relative: 7.1 %
Neutro Abs: 5728 cells/uL (ref 1500–7800)
Neutrophils Relative %: 72.5 %
Platelets: 268 10*3/uL (ref 140–400)
RBC: 4.74 10*6/uL (ref 4.20–5.80)
RDW: 11.8 % (ref 11.0–15.0)
Total Lymphocyte: 16.3 %
WBC: 7.9 10*3/uL (ref 3.8–10.8)

## 2020-08-01 LAB — HEPATITIS C ANTIBODY
Hepatitis C Ab: NONREACTIVE
SIGNAL TO CUT-OFF: 0.01 (ref <1.00)

## 2020-08-01 LAB — HEMOGLOBIN A1C
Hgb A1c MFr Bld: 5.9 % of total Hgb — ABNORMAL HIGH (ref <5.7)
Mean Plasma Glucose: 123 (calc)
eAG (mmol/L): 6.8 (calc)

## 2020-08-01 LAB — VITAMIN D 25 HYDROXY (VIT D DEFICIENCY, FRACTURES): Vit D, 25-Hydroxy: 33 ng/mL (ref 30–100)

## 2020-08-08 ENCOUNTER — Telehealth: Payer: Self-pay

## 2020-08-08 ENCOUNTER — Other Ambulatory Visit: Payer: Self-pay

## 2020-08-08 ENCOUNTER — Ambulatory Visit (INDEPENDENT_AMBULATORY_CARE_PROVIDER_SITE_OTHER): Payer: Medicare PPO

## 2020-08-08 VITALS — BP 130/80 | HR 72

## 2020-08-08 DIAGNOSIS — I10 Essential (primary) hypertension: Secondary | ICD-10-CM

## 2020-08-08 MED ORDER — ROSUVASTATIN CALCIUM 40 MG PO TABS: 40.0000 mg | ORAL_TABLET | Freq: Every day | ORAL | 1 refills | Status: DC

## 2020-08-08 MED ORDER — LOSARTAN POTASSIUM-HCTZ 100-25 MG PO TABS: 1.0000 | ORAL_TABLET | Freq: Every day | ORAL | 1 refills | Status: DC

## 2020-08-08 NOTE — Progress Notes (Signed)
Patient is here for a blood pressure check. Patient denies chest pain, palpitations, shortness of breath or visual disturbances. At previous visit blood pressure was 150/80 with a heart rate of 74. Today during nurse visit first check blood pressure was 130/80 with a heart rate of 72. He does take blood pressure medications as prescribed with no missed doses. He has been taking Losartan-HCTZ 50.12.5 twice daily since his last visit.  He would also like to know what would you suggest about his increased LDL. Should he add Zetia or change to Crestor.

## 2020-08-08 NOTE — Telephone Encounter (Signed)
Copied from [place] [id number]. Topic: General - Other >> Aug 08, 2020  2:26 PM Hinda Lenis D wrote: Pts wife need to speak with Sinead Hockman about the new medications / please advise

## 2020-08-21 DIAGNOSIS — D0462 Carcinoma in situ of skin of left upper limb, including shoulder: Secondary | ICD-10-CM | POA: Diagnosis not present

## 2020-09-04 DIAGNOSIS — C44519 Basal cell carcinoma of skin of other part of trunk: Secondary | ICD-10-CM | POA: Diagnosis not present

## 2020-09-15 ENCOUNTER — Other Ambulatory Visit: Payer: Self-pay | Admitting: Family Medicine

## 2020-09-15 DIAGNOSIS — G250 Essential tremor: Secondary | ICD-10-CM

## 2020-10-24 ENCOUNTER — Ambulatory Visit (INDEPENDENT_AMBULATORY_CARE_PROVIDER_SITE_OTHER): Payer: Medicare PPO

## 2020-10-24 DIAGNOSIS — Z0000 Encounter for general adult medical examination without abnormal findings: Secondary | ICD-10-CM | POA: Diagnosis not present

## 2020-10-24 NOTE — Progress Notes (Signed)
Subjective:   Zachary Meyer is a 79 y.o. male who presents for Medicare Annual/Subsequent preventive examination.  Virtual Visit via Telephone Note  I connected with  Zachary Meyer on 10/24/20 at  8:40 AM EST by telephone and verified that I am speaking with the correct person using two identifiers.  Location: Patient: home Provider: [place] Persons participating in the virtual visit: [place]   I discussed the limitations, risks, security and privacy concerns of performing an evaluation and management service by telephone and the availability of in person appointments. The patient expressed understanding and agreed to proceed.  Interactive audio and video telecommunications were attempted between this nurse and patient, however failed, due to patient having technical difficulties OR patient did not have access to video capability.  We continued and completed visit with audio only.  Some vital signs may be absent or patient reported.   Clemetine Marker, LPN    Review of Systems     Cardiac Risk Factors include: advanced age (>84men, >64 women);male gender;dyslipidemia;hypertension     Objective:    There were no vitals filed for this visit. There is no height or weight on file to calculate BMI.  Advanced Directives 10/24/2020 10/15/2019 10/13/2018 09/19/2017 08/05/2017 07/18/2017 04/18/2017  Does Patient Have a Medical Advance Directive? Yes Yes Yes Yes Yes Yes No  Type of Paramedic of [place];Living will [place];Living will - [place];Living will - - -  Does patient want to make changes to medical advance directive? - - - - - - -  Copy of [place] in Chart? No - copy requested No - copy requested - No - copy requested - - -    Current Medications (verified) Outpatient Encounter Medications as of 10/24/2020  Medication Sig  . amLODipine (NORVASC) 5 MG tablet Take 1 tablet  (5 mg total) by mouth daily.  Marland Kitchen aspirin EC 81 MG tablet Take 1 tablet (81 mg total) by mouth daily.  . Cholecalciferol (VITAMIN D3) 50 MCG (2000 UT) TABS Take 1 tablet by mouth daily.  . fluticasone (FLONASE) 50 MCG/ACT nasal spray SPRAY 2 SPRAYS INTO EACH NOSTRIL EVERY DAY  . losartan-hydrochlorothiazide (HYZAAR) 100-25 MG tablet Take 1 tablet by mouth daily.  Marland Kitchen omeprazole (PRILOSEC) 20 MG capsule TAKE 1 CAPSULE BY MOUTH EVERY DAY  . propranolol (INDERAL) 20 MG tablet TAKE 1 TABLET BY MOUTH TWICE A DAY  . rosuvastatin (CRESTOR) 40 MG tablet Take 1 tablet (40 mg total) by mouth daily.   No facility-administered encounter medications on file as of 10/24/2020.    Allergies (verified) Patient has no known allergies.   History: Past Medical History:  Diagnosis Date  . Cancer (HCC)    Skin Cancer ([place])  . Chronic kidney disease, stage III (moderate) (HCC)   . GERD (gastroesophageal reflux disease)   . Hyperlipidemia   . Hypertension    Past Surgical History:  Procedure Laterality Date  . COLONOSCOPY WITH PROPOFOL    . COLONOSCOPY WITH PROPOFOL N/A 10/13/2018   Procedure: COLONOSCOPY WITH PROPOFOL;  Surgeon: Lucilla Lame, MD;  Location: [facility] ENDOSCOPY;  Service: Endoscopy;  Laterality: N/A;  . MELANOMA EXCISION Left 12/2012   Family History  Problem Relation Age of Onset  . Hypertension Sister   . Hypertension Brother   . Heart attack Mother    Social History   Socioeconomic History  . Marital status: Married    Spouse name: Zachary Meyer   .  Number of children: 2  . Years of education: Not on file  . Highest education level: 10th grade  Occupational History  . Occupation: driver     Comment: road crew for [employer]   Tobacco Use  . Smoking status: Former Smoker    Packs/day: 0.50    Years: 10.00    Pack years: 5.00    Types: Cigarettes    Start date: 11/25/1953    Quit date: 01/22/1964    Years since quitting: 56.7  . Smokeless tobacco: Never Used    Vaping Use  . Vaping Use: Never used  Substance and Sexual Activity  . Alcohol use: No    Alcohol/week: 0.0 standard drinks  . Drug use: No  . Sexual activity: Not Currently    Birth control/protection: None  Other Topics Concern  . Not on file  Social History Narrative   Married, independent. Retired    Building services engineer once a week    He has 3 grand-children and one great-grandchild    Social Determinants of Radio broadcast assistant Strain: Low Risk   . Difficulty of Paying Living Expenses: Not hard at all  Food Insecurity: No Food Insecurity  . Worried About Charity fundraiser in the Last Year: Never true  . Ran Out of Food in the Last Year: Never true  Transportation Needs: No Transportation Needs  . Lack of Transportation (Medical): No  . Lack of Transportation (Non-Medical): No  Physical Activity: Insufficiently Active  . Days of Exercise per Week: 3 days  . Minutes of Exercise per Session: 30 min  Stress: No Stress Concern Present  . Feeling of Stress : Not at all  Social Connections: Socially Integrated  . Frequency of Communication with Friends and Family: More than three times a week  . Frequency of Social Gatherings with Friends and Family: Three times a week  . Attends Religious Services: More than 4 times per year  . Active Member of Clubs or Organizations: Yes  . Attends Archivist Meetings: More than 4 times per year  . Marital Status: Married    Tobacco Counseling Counseling given: Not Answered   Clinical Intake:  Pre-visit preparation completed: Yes  Pain : No/denies pain     Nutritional Risks: None Diabetes: No  How often do you need to have someone help you when you read instructions, pamphlets, or other written materials from your doctor or pharmacy?: 1 - Never    Interpreter Needed?: No  Information entered by :: Clemetine Marker LPN   Activities of Daily Living In your present state of health, do you have any difficulty  performing the following activities: 10/24/2020 07/28/2020  Hearing? N N  Comment declines hearing aids -  Vision? N N  Difficulty concentrating or making decisions? N N  Walking or climbing stairs? N N  Dressing or bathing? N N  Doing errands, shopping? N N  Preparing Food and eating ? N -  Using the Toilet? N -  In the past six months, have you accidently leaked urine? N -  Do you have problems with loss of bowel control? N -  Managing your Medications? N -  Managing your Finances? N -  Housekeeping or managing your Housekeeping? N -  Some recent data might be hidden    Patient Care Team: Steele Sizer, MD as PCP - General (Family Medicine) Dasher, Rayvon Char, MD as Consulting Physician (Dermatology)  Indicate any recent Medical Services you may have received  from other than [facility] providers in the past year (date may be approximate).     Assessment:   This is a routine wellness examination for Zachary Meyer.  Hearing/Vision screen  Hearing Screening   125Hz  250Hz  500Hz  1000Hz  2000Hz  3000Hz  4000Hz  6000Hz  8000Hz   Right ear:           Left ear:           Comments: Pt denies hearing difficulty  Vision Screening Comments: Annual vision screenings done at [place] issues and exercise activities discussed: Current Exercise Habits: Home exercise routine, Type of exercise: Other - see comments (golf, yard work), Time (Minutes): 30, Frequency (Times/Week): 3, Weekly Exercise (Minutes/Week): 90, Intensity: Mild, Exercise limited by: None identified  Goals    . Reduce portion size     Recommend to reduce portion size of meals to 3 small healthy meals per day and 2 healthy snacks per day      Depression Screen PHQ 2/9 Scores 10/24/2020 07/28/2020 01/25/2020 11/15/2019 10/15/2019 07/23/2019 01/21/2019  PHQ - 2 Score 0 0 0 0 0 0 0  PHQ- 9 Score - - 0 0 - 0 0    Fall Risk Fall Risk  10/24/2020 07/28/2020 01/25/2020 11/15/2019 10/15/2019  Falls in the past year? 0 0 0 0 0  Number  falls in past yr: 0 0 0 0 0  Injury with Fall? 0 0 0 0 0  Comment - - - - -  Risk for fall due to : No Fall Risks - - - -  Follow up Falls prevention discussed - - - Falls prevention discussed    Any stairs in or around the home? Yes  If so, are there any without handrails? No  Home free of loose throw rugs in walkways, pet beds, electrical cords, etc? Yes  Adequate lighting in your home to reduce risk of falls? Yes   ASSISTIVE DEVICES UTILIZED TO PREVENT FALLS:  Life alert? No  Use of a cane, walker or w/c? No  Grab bars in the bathroom? No  Shower chair or bench in shower? No  Elevated toilet seat or a handicapped toilet? Yes   TIMED UP AND GO:  Was the test performed? No . Telephonic visit.   Cognitive Function:     6CIT Screen 10/24/2020 10/15/2019 09/22/2018 09/19/2017  What Year? 0 points 0 points 0 points 0 points  What month? 0 points 0 points 0 points -  What time? 0 points 0 points 0 points 0 points  Count back from 20 0 points 0 points 0 points 0 points  Months in reverse 0 points 0 points 0 points 0 points  Repeat phrase 0 points 0 points 2 points 4 points  Total Score 0 0 2 -    Immunizations Immunization History  Administered Date(s) Administered  . Fluad Quad(high Dose 65+) 07/23/2019, 07/28/2020  . Influenza, High Dose Seasonal PF 08/27/2016, 09/08/2017, 09/22/2018  . PFIZER SARS-COV-2 Vaccination 12/14/2019, 01/08/2020, 09/13/2020  . Pneumococcal Conjugate-13 01/21/2018  . Pneumococcal Polysaccharide-23 08/27/2016  . Zoster 10/24/2016    TDAP status: Due, Education has been provided regarding the importance of this vaccine. Advised may receive this vaccine at local pharmacy or Health Dept. Aware to provide a copy of the vaccination record if obtained from local pharmacy or Health Dept. Verbalized acceptance and understanding.   Flu Vaccine status: Up to date   Pneumococcal vaccine status: Up to date   Covid-19 vaccine status: Completed  vaccines  Qualifies for Shingles  Vaccine? Yes   Zostavax completed Yes   Shingrix Completed?: No.    Education has been provided regarding the importance of this vaccine. Patient has been advised to call insurance company to determine out of pocket expense if they have not yet received this vaccine. Advised may also receive vaccine at local pharmacy or Health Dept. Verbalized acceptance and understanding.  Screening Tests Health Maintenance  Topic Date Due  . TETANUS/TDAP  01/24/2021 (Originally 11/05/1960)  . COLONOSCOPY  10/14/2023  . INFLUENZA VACCINE  Completed  . COVID-19 Vaccine  Completed  . Hepatitis C Screening  Completed  . PNA vac Low Risk Adult  Completed    Health Maintenance  There are no preventive care reminders to display for this patient.  Colorectal cancer screening: Completed 10/13/18. Repeat every 5 years  Lung Cancer Screening: (Low Dose CT Chest recommended if Age 31-80 years, 30 pack-year currently smoking OR have quit w/in 15years.) does not qualify.   Additional Screening:  Hepatitis C Screening: does qualify; Completed 07/28/20  Vision Screening: Recommended annual ophthalmology exams for early detection of glaucoma and other disorders of the eye. Is the patient up to date with their annual eye exam?  Yes  Who is the provider or what is the name of the office in which the patient attends annual eye exams? Dr. Ellin Mayhew  Dental Screening: Recommended annual dental exams for proper oral hygiene  Community Resource Referral / Chronic Care Management: CRR required this visit?  No   CCM required this visit?  No      Plan:     I have personally reviewed and noted the following in the patient's chart:   . Medical and social history . Use of alcohol, tobacco or illicit drugs  . Current medications and supplements . Functional ability and status . Nutritional status . Physical activity . Advanced directives . List of other  physicians . Hospitalizations, surgeries, and ER visits in previous 12 months . Vitals . Screenings to include cognitive, depression, and falls . Referrals and appointments  In addition, I have reviewed and discussed with patient certain preventive protocols, quality metrics, and best practice recommendations. A written personalized care plan for preventive services as well as general preventive health recommendations were provided to patient.     Clemetine Marker, LPN   [id number]   Nurse Notes: pt doing well and appreciative of visit today.

## 2020-10-24 NOTE — Patient Instructions (Signed)
Zachary Meyer , Thank you for taking time to come for your Medicare Wellness Visit. I appreciate your ongoing commitment to your health goals. Please review the following plan we discussed and let me know if I can assist you in the future.   Screening recommendations/referrals: Colonoscopy: done 10/13/18. Repeat in 2024 Recommended yearly ophthalmology/optometry visit for glaucoma screening and checkup Recommended yearly dental visit for hygiene and checkup  Vaccinations: Influenza vaccine: done 07/28/20 Pneumococcal vaccine: done 01/21/18 Tdap vaccine: due Shingles vaccine: Shingrix discussed. Please contact your pharmacy for coverage information.  Covid-19: done 12/14/19, 01/08/20 & 09/13/20  Advanced directives: Please bring a copy of your health care power of attorney and living will to the office at your convenience.  Conditions/risks identified: Keep up the great work!  Next appointment: Follow up in one year for your annual wellness visit.   Preventive Care 77 Years and Older, Male Preventive care refers to lifestyle choices and visits with your health care provider that can promote health and wellness. What does preventive care include?  A yearly physical exam. This is also called an annual well check.  Dental exams once or twice a year.  Routine eye exams. Ask your health care provider how often you should have your eyes checked.  Personal lifestyle choices, including:  Daily care of your teeth and gums.  Regular physical activity.  Eating a healthy diet.  Avoiding tobacco and drug use.  Limiting alcohol use.  Practicing safe sex.  Taking low doses of aspirin every day.  Taking vitamin and mineral supplements as recommended by your health care provider. What happens during an annual well check? The services and screenings done by your health care provider during your annual well check will depend on your age, overall health, lifestyle risk factors, and family history  of disease. Counseling  Your health care provider may ask you questions about your:  Alcohol use.  Tobacco use.  Drug use.  Emotional well-being.  Home and relationship well-being.  Sexual activity.  Eating habits.  History of falls.  Memory and ability to understand (cognition).  Work and work Statistician. Screening  You may have the following tests or measurements:  Height, weight, and BMI.  Blood pressure.  Lipid and cholesterol levels. These may be checked every 5 years, or more frequently if you are over 43 years old.  Skin check.  Lung cancer screening. You may have this screening every year starting at age 47 if you have a 30-pack-year history of smoking and currently smoke or have quit within the past 15 years.  Fecal occult blood test (FOBT) of the stool. You may have this test every year starting at age 35.  Flexible sigmoidoscopy or colonoscopy. You may have a sigmoidoscopy every 5 years or a colonoscopy every 10 years starting at age 54.  Prostate cancer screening. Recommendations will vary depending on your family history and other risks.  Hepatitis C blood test.  Hepatitis B blood test.  Sexually transmitted disease (STD) testing.  Diabetes screening. This is done by checking your blood sugar (glucose) after you have not eaten for a while (fasting). You may have this done every 1-3 years.  Abdominal aortic aneurysm (AAA) screening. You may need this if you are a current or former smoker.  Osteoporosis. You may be screened starting at age 92 if you are at high risk. Talk with your health care provider about your test results, treatment options, and if necessary, the need for more tests. Vaccines  Your health  care provider may recommend certain vaccines, such as:  Influenza vaccine. This is recommended every year.  Tetanus, diphtheria, and acellular pertussis (Tdap, Td) vaccine. You may need a Td booster every 10 years.  Zoster vaccine. You may  need this after age 8.  Pneumococcal 13-valent conjugate (PCV13) vaccine. One dose is recommended after age 66.  Pneumococcal polysaccharide (PPSV23) vaccine. One dose is recommended after age 40. Talk to your health care provider about which screenings and vaccines you need and how often you need them. This information is not intended to replace advice given to you by your health care provider. Make sure you discuss any questions you have with your health care provider. Document Released: 12/08/2015 Document Revised: 07/31/2016 Document Reviewed: 09/12/2015 Elsevier Interactive Patient Education  2017 [employer] Prevention in the Home Falls can cause injuries. They can happen to people of all ages. There are many things you can do to make your home safe and to help prevent falls. What can I do on the outside of my home?  Regularly fix the edges of walkways and driveways and fix any cracks.  Remove anything that might make you trip as you walk through a door, such as a raised step or threshold.  Trim any bushes or trees on the path to your home.  Use bright outdoor lighting.  Clear any walking paths of anything that might make someone trip, such as rocks or tools.  Regularly check to see if handrails are loose or broken. Make sure that both sides of any steps have handrails.  Any raised decks and porches should have guardrails on the edges.  Have any leaves, snow, or ice cleared regularly.  Use sand or salt on walking paths during winter.  Clean up any spills in your garage right away. This includes oil or grease spills. What can I do in the bathroom?  Use night lights.  Install grab bars by the toilet and in the tub and shower. Do not use towel bars as grab bars.  Use non-skid mats or decals in the tub or shower.  If you need to sit down in the shower, use a plastic, non-slip stool.  Keep the floor dry. Clean up any water that spills on the floor as soon as it  happens.  Remove soap buildup in the tub or shower regularly.  Attach bath mats securely with double-sided non-slip rug tape.  Do not have throw rugs and other things on the floor that can make you trip. What can I do in the bedroom?  Use night lights.  Make sure that you have a light by your bed that is easy to reach.  Do not use any sheets or blankets that are too big for your bed. They should not hang down onto the floor.  Have a firm chair that has side arms. You can use this for support while you get dressed.  Do not have throw rugs and other things on the floor that can make you trip. What can I do in the kitchen?  Clean up any spills right away.  Avoid walking on wet floors.  Keep items that you use a lot in easy-to-reach places.  If you need to reach something above you, use a strong step stool that has a grab bar.  Keep electrical cords out of the way.  Do not use floor polish or wax that makes floors slippery. If you must use wax, use non-skid floor wax.  Do not have  throw rugs and other things on the floor that can make you trip. What can I do with my stairs?  Do not leave any items on the stairs.  Make sure that there are handrails on both sides of the stairs and use them. Fix handrails that are broken or loose. Make sure that handrails are as long as the stairways.  Check any carpeting to make sure that it is firmly attached to the stairs. Fix any carpet that is loose or worn.  Avoid having throw rugs at the top or bottom of the stairs. If you do have throw rugs, attach them to the floor with carpet tape.  Make sure that you have a light switch at the top of the stairs and the bottom of the stairs. If you do not have them, ask someone to add them for you. What else can I do to help prevent falls?  Wear shoes that:  Do not have high heels.  Have rubber bottoms.  Are comfortable and fit you well.  Are closed at the toe. Do not wear sandals.  If you  use a stepladder:  Make sure that it is fully opened. Do not climb a closed stepladder.  Make sure that both sides of the stepladder are locked into place.  Ask someone to hold it for you, if possible.  Clearly mark and make sure that you can see:  Any grab bars or handrails.  First and last steps.  Where the edge of each step is.  Use tools that help you move around (mobility aids) if they are needed. These include:  Canes.  Walkers.  Scooters.  Crutches.  Turn on the lights when you go into a dark area. Replace any light bulbs as soon as they burn out.  Set up your furniture so you have a clear path. Avoid moving your furniture around.  If any of your floors are uneven, fix them.  If there are any pets around you, be aware of where they are.  Review your medicines with your doctor. Some medicines can make you feel dizzy. This can increase your chance of falling. Ask your doctor what other things that you can do to help prevent falls. This information is not intended to replace advice given to you by your health care provider. Make sure you discuss any questions you have with your health care provider. Document Released: 09/07/2009 Document Revised: 04/18/2016 Document Reviewed: 12/16/2014 Elsevier Interactive Patient Education  2017 [employer].

## 2020-11-27 ENCOUNTER — Other Ambulatory Visit: Payer: Self-pay | Admitting: Family Medicine

## 2020-11-27 DIAGNOSIS — I10 Essential (primary) hypertension: Secondary | ICD-10-CM

## 2021-01-02 ENCOUNTER — Other Ambulatory Visit: Payer: Self-pay | Admitting: Family Medicine

## 2021-01-02 DIAGNOSIS — K219 Gastro-esophageal reflux disease without esophagitis: Secondary | ICD-10-CM

## 2021-01-10 DIAGNOSIS — D225 Melanocytic nevi of trunk: Secondary | ICD-10-CM | POA: Diagnosis not present

## 2021-01-10 DIAGNOSIS — X32XXXA Exposure to sunlight, initial encounter: Secondary | ICD-10-CM | POA: Diagnosis not present

## 2021-01-10 DIAGNOSIS — L821 Other seborrheic keratosis: Secondary | ICD-10-CM | POA: Diagnosis not present

## 2021-01-10 DIAGNOSIS — D2261 Melanocytic nevi of right upper limb, including shoulder: Secondary | ICD-10-CM | POA: Diagnosis not present

## 2021-01-10 DIAGNOSIS — L570 Actinic keratosis: Secondary | ICD-10-CM | POA: Diagnosis not present

## 2021-01-10 DIAGNOSIS — D2262 Melanocytic nevi of left upper limb, including shoulder: Secondary | ICD-10-CM | POA: Diagnosis not present

## 2021-01-10 DIAGNOSIS — Z85828 Personal history of other malignant neoplasm of skin: Secondary | ICD-10-CM | POA: Diagnosis not present

## 2021-01-10 DIAGNOSIS — D2272 Melanocytic nevi of left lower limb, including hip: Secondary | ICD-10-CM | POA: Diagnosis not present

## 2021-01-24 NOTE — Progress Notes (Unsigned)
Name: Zachary Meyer   MRN: [id number]    DOB: 14-Oct-1941   Date:01/26/2021       Progress Note  Subjective  Chief Complaint  Follow up   HPI   HTN: He takes ARB, HCTZ and low dose Norvas. He is on Propanolol but was added by neurologist for essential tremors, bp is slightly high, he asked to go up on Propranolol since tremors a little worse on left hand. Discussed importance of monitoring his heart rage. We will try going from propanolol 20 mg twice daily to Propranolol 60 mg ER He denies chest pain, palpitation or SOB,he has some dizziness when stooping down or standing up from squatting   Dyslipidemia: he has low HDL, last LDL was 95 , we changed to Crestor and we will recheck labs today, no myalgia   CKI stage III: based on low GFR for years.  avoid nsaid's, he is drinking more water and avoiding sweet tea. He only takes Tylenol for pain prn. We will recheck labs today   Hyperglycemia: no polyphagia, polydipsia or polyuriabut he has urinary frequency but only when at home, able to play golf without problems .  Unchanged   Tremors: chronic, seen by neurologist, aggravated when he uses weed eater, he has noticed a little worse lately, only left hand , he is left hand dominant   Senile purpura: he has easy bruising and thin skin, reassurance given Unchanged   Aorta Ectasy: he is on statin therapy and baby aspirin, repeat AAA in 2024. Unchanged   Patient Active Problem List   Diagnosis Date Noted  . Personal history of colonic polyps   . Benign neoplasm of ascending colon   . Benign neoplasm of transverse colon   . Polyp of sigmoid colon   . Senile purpura ([place]) 07/21/2018  . Chronic kidney disease, stage III (moderate) ([place]) 07/21/2018  . Vitamin D deficiency 07/21/2018  . Hyperglycemia 01/23/2016  . Hypertension 07/25/2015  . GERD (gastroesophageal reflux disease) 07/25/2015  . Hyperlipidemia 07/25/2015  . Perennial allergic rhinitis 07/25/2015  . Benign essential  tremor 07/25/2015    Past Surgical History:  Procedure Laterality Date  . COLONOSCOPY WITH PROPOFOL    . COLONOSCOPY WITH PROPOFOL N/A 10/13/2018   Procedure: COLONOSCOPY WITH PROPOFOL;  Surgeon: Lucilla Lame, MD;  [facility];  Service: Endoscopy;  Laterality: N/A;  . MELANOMA EXCISION Left 12/2012    Family History  Problem Relation Age of Onset  . Hypertension Sister   . Hypertension Brother   . Heart attack Mother     Social History   Tobacco Use  . Smoking status: Former Smoker    Packs/day: 0.50    Years: 10.00    Pack years: 5.00    Types: Cigarettes    Start date: 11/25/1953    Quit date: 01/22/1964    Years since quitting: 57.0  . Smokeless tobacco: Never Used  Substance Use Topics  . Alcohol use: No    Alcohol/week: 0.0 standard drinks     Current Outpatient Medications:  .  amLODipine (NORVASC) 5 MG tablet, TAKE 1 TABLET BY MOUTH EVERY DAY, Disp: 90 tablet, Rfl: 0 .  aspirin EC 81 MG tablet, Take 1 tablet (81 mg total) by mouth daily., Disp: 30 tablet, Rfl: 0 .  Cholecalciferol (VITAMIN D3) 50 MCG (2000 UT) TABS, Take 1 tablet by mouth daily., Disp: 30 tablet, Rfl: 0 .  fluticasone (FLONASE) 50 MCG/ACT nasal spray, SPRAY 2 SPRAYS INTO EACH NOSTRIL EVERY DAY, Disp: 48  mL, Rfl: 2 .  losartan-hydrochlorothiazide (HYZAAR) 100-25 MG tablet, Take 1 tablet by mouth daily., Disp: 90 tablet, Rfl: 1 .  omeprazole (PRILOSEC) 20 MG capsule, TAKE 1 CAPSULE BY MOUTH EVERY DAY, Disp: 90 capsule, Rfl: 1 .  propranolol (INDERAL) 20 MG tablet, TAKE 1 TABLET BY MOUTH TWICE A DAY, Disp: 180 tablet, Rfl: 1 .  rosuvastatin (CRESTOR) 40 MG tablet, Take 1 tablet (40 mg total) by mouth daily., Disp: 90 tablet, Rfl: 1  No Known Allergies  I personally reviewed active problem list, medication list, allergies, family history, social history, health maintenance with the patient/caregiver today.   ROS  Constitutional: Negative for fever or weight change.  Respiratory:  Negative for cough and shortness of breath.   Cardiovascular: Negative for chest pain or palpitations.  Gastrointestinal: Negative for abdominal pain, no bowel changes.  Musculoskeletal: Negative for gait problem or joint swelling.  Skin: Negative for rash.  Neurological: Negative for dizziness or headache. Tremors of left hand  No other specific complaints in a complete review of systems (except as listed in HPI above).  Objective  Vitals:   01/26/21 0951  BP: (!) 142/78  Pulse: 73  Resp: 18  Temp: 98 F (36.7 C)  TempSrc: Oral  SpO2: 98%  Weight: 209 lb 6.4 oz (95 kg)  Height: 6' (1.829 m)    Body mass index is 28.4 kg/m.  Physical Exam  Constitutional: Patient appears well-developed and well-nourished. Overweight.  No distress.  HEENT: head atraumatic, normocephalic, pupils equal and reactive to light,neck supple Cardiovascular: Normal rate, regular rhythm and normal heart sounds.  No murmur heard. No BLE edema. Pulmonary/Chest: Effort normal and breath sounds normal. No respiratory distress. Abdominal: Soft.  There is no tenderness. Neurological: tremors left hand Psychiatric: Patient has a normal mood and affect. behavior is normal. Judgment and thought content normal.  PHQ2/9: Depression screen [facility] 2/9 01/26/2021 10/24/2020 07/28/2020 01/25/2020 11/15/2019  Decreased Interest 0 0 0 0 0  Down, Depressed, Hopeless 0 0 0 0 0  PHQ - 2 Score 0 0 0 0 0  Altered sleeping - - - 0 0  Tired, decreased energy - - - 0 0  Change in appetite - - - 0 0  Feeling bad or failure about yourself  - - - 0 0  Trouble concentrating - - - 0 0  Moving slowly or fidgety/restless - - - 0 0  Suicidal thoughts - - - 0 -  PHQ-9 Score - - - 0 0  Difficult doing work/chores - - - - Not difficult at all  Some recent data might be hidden    phq 9 is negative   Fall Risk: Fall Risk  01/26/2021 10/24/2020 07/28/2020 01/25/2020 11/15/2019  Falls in the past year? 0 0 0 0 0  Number falls in past yr: 0  0 0 0 0  Injury with Fall? 0 0 0 0 0  Comment - - - - -  Risk for fall due to : - No Fall Risks - - -  Follow up - Falls prevention discussed - - -     Functional Status Survey: Is the patient deaf or have difficulty hearing?: No Does the patient have difficulty seeing, even when wearing glasses/contacts?: No Does the patient have difficulty concentrating, remembering, or making decisions?: No Does the patient have difficulty walking or climbing stairs?: No Does the patient have difficulty dressing or bathing?: No Does the patient have difficulty doing errands alone such as visiting a doctor's office  or shopping?: No   Assessment and plan:    1. Essential hypertension  - propranolol ER (INDERAL LA) 60 MG 24 hr capsule; Take 1 capsule (60 mg total) by mouth daily.  Dispense: 90 capsule; Refill: 0 - losartan-hydrochlorothiazide (HYZAAR) 100-25 MG tablet; Take 1 tablet by mouth daily.  Dispense: 90 tablet; Refill: 1 - amLODipine (NORVASC) 5 MG tablet; Take 1 tablet (5 mg total) by mouth daily.  Dispense: 90 tablet; Refill: 0 - COMPLETE METABOLIC PANEL WITH GFR  2. Dyslipidemia (high LDL; low HDL)  - rosuvastatin (CRESTOR) 40 MG tablet; Take 1 tablet (40 mg total) by mouth daily.  Dispense: 90 tablet; Refill: 1 - Lipid panel  3. Senile purpura (HCC)  Stable  4. Ectatic abdominal aorta (HCC)  On statin therapy   5. Stage 3a chronic kidney disease ([place])  Recheck labs   6. Benign essential tremor  Advised to monitor heart rate at home since we are adjusting dose and to let me know prior to next visit  - propranolol ER (INDERAL LA) 60 MG 24 hr capsule; Take 1 capsule (60 mg total) by mouth daily.  Dispense: 90 capsule; Refill: 0  7. Benign hypertension with chronic kidney disease, stage III (HCC)  - COMPLETE METABOLIC PANEL WITH GFR

## 2021-01-26 ENCOUNTER — Other Ambulatory Visit: Payer: Self-pay

## 2021-01-26 ENCOUNTER — Encounter: Payer: Self-pay | Admitting: Family Medicine

## 2021-01-26 ENCOUNTER — Ambulatory Visit: Payer: Medicare PPO | Admitting: Family Medicine

## 2021-01-26 VITALS — BP 142/78 | HR 73 | Temp 98.0°F | Resp 18 | Ht 72.0 in | Wt 209.4 lb

## 2021-01-26 DIAGNOSIS — I10 Essential (primary) hypertension: Secondary | ICD-10-CM | POA: Diagnosis not present

## 2021-01-26 DIAGNOSIS — I77811 Abdominal aortic ectasia: Secondary | ICD-10-CM

## 2021-01-26 DIAGNOSIS — D692 Other nonthrombocytopenic purpura: Secondary | ICD-10-CM

## 2021-01-26 DIAGNOSIS — N183 Chronic kidney disease, stage 3 unspecified: Secondary | ICD-10-CM

## 2021-01-26 DIAGNOSIS — I129 Hypertensive chronic kidney disease with stage 1 through stage 4 chronic kidney disease, or unspecified chronic kidney disease: Secondary | ICD-10-CM

## 2021-01-26 DIAGNOSIS — E785 Hyperlipidemia, unspecified: Secondary | ICD-10-CM

## 2021-01-26 DIAGNOSIS — N1831 Chronic kidney disease, stage 3a: Secondary | ICD-10-CM | POA: Diagnosis not present

## 2021-01-26 DIAGNOSIS — G250 Essential tremor: Secondary | ICD-10-CM

## 2021-01-26 MED ORDER — AMLODIPINE BESYLATE 5 MG PO TABS: 5.0000 mg | ORAL_TABLET | Freq: Every day | ORAL | 0 refills | Status: DC

## 2021-01-26 MED ORDER — PROPRANOLOL HCL ER 60 MG PO CP24: 60.0000 mg | ORAL_CAPSULE | Freq: Every day | ORAL | 0 refills | Status: DC

## 2021-01-26 MED ORDER — ROSUVASTATIN CALCIUM 40 MG PO TABS: 40.0000 mg | ORAL_TABLET | Freq: Every day | ORAL | 1 refills | Status: DC

## 2021-01-26 MED ORDER — LOSARTAN POTASSIUM-HCTZ 100-25 MG PO TABS: 1.0000 | ORAL_TABLET | Freq: Every day | ORAL | 1 refills | Status: DC

## 2021-01-27 LAB — COMPLETE METABOLIC PANEL WITH GFR
AG Ratio: 1.9 (calc) (ref 1.0–2.5)
ALT: 19 U/L (ref 9–46)
AST: 18 U/L (ref 10–35)
Albumin: 4.5 g/dL (ref 3.6–5.1)
Alkaline phosphatase (APISO): 76 U/L (ref 35–144)
BUN/Creatinine Ratio: 15 (calc) (ref 6–22)
BUN: 21 mg/dL (ref 7–25)
CO2: 28 mmol/L (ref 20–32)
Calcium: 9.9 mg/dL (ref 8.6–10.3)
Chloride: 104 mmol/L (ref 98–110)
Creat: 1.44 mg/dL — ABNORMAL HIGH (ref 0.70–1.18)
GFR, Est African American: 54 mL/min/{1.73_m2} — ABNORMAL LOW (ref >=60)
GFR, Est Non African American: 47 mL/min/{1.73_m2} — ABNORMAL LOW (ref >=60)
Globulin: 2.4 g/dL (calc) (ref 1.9–3.7)
Glucose, Bld: 100 mg/dL — ABNORMAL HIGH (ref 65–99)
Potassium: 4.2 mmol/L (ref 3.5–5.3)
Sodium: 141 mmol/L (ref 135–146)
Total Bilirubin: 0.6 mg/dL (ref 0.2–1.2)
Total Protein: 6.9 g/dL (ref 6.1–8.1)

## 2021-01-27 LAB — LIPID PANEL
Cholesterol: 132 mg/dL (ref <200)
HDL: 34 mg/dL — ABNORMAL LOW (ref >=40)
LDL Cholesterol (Calc): 78 mg/dL (calc)
Non-HDL Cholesterol (Calc): 98 mg/dL (calc) (ref <130)
Total CHOL/HDL Ratio: 3.9 (calc) (ref <5.0)
Triglycerides: 115 mg/dL (ref <150)

## 2021-03-06 ENCOUNTER — Other Ambulatory Visit: Payer: Self-pay | Admitting: Family Medicine

## 2021-03-06 DIAGNOSIS — G250 Essential tremor: Secondary | ICD-10-CM

## 2021-04-22 ENCOUNTER — Other Ambulatory Visit: Payer: Self-pay | Admitting: Family Medicine

## 2021-04-22 DIAGNOSIS — I10 Essential (primary) hypertension: Secondary | ICD-10-CM

## 2021-04-22 DIAGNOSIS — G250 Essential tremor: Secondary | ICD-10-CM

## 2021-04-22 NOTE — Telephone Encounter (Signed)
Requested Prescriptions  Pending Prescriptions Disp Refills  . propranolol ER (INDERAL LA) 60 MG 24 hr capsule [Pharmacy Med Name: PROPRANOLOL ER 60 MG CAPSULE] 90 capsule 0    Sig: TAKE 1 CAPSULE BY MOUTH EVERY DAY     Cardiovascular:  Beta Blockers Failed - 04/22/2021  9:09 AM      Failed - Last BP in normal range    BP Readings from Last 1 Encounters:  01/26/21 (!) 142/78         Passed - Last Heart Rate in normal range    Pulse Readings from Last 1 Encounters:  01/26/21 73         Passed - Valid encounter within last 6 months    Recent Outpatient Visits          2 months ago Essential hypertension   [facility] Steele Sizer, MD   8 months ago Ectatic abdominal aorta [facility])   [facility] Steele Sizer, MD   1 year ago Dyslipidemia (high LDL; low HDL)   [facility] Steele Sizer, MD   1 year ago Vertigo   [facility] Steele Sizer, MD   1 year ago Ectatic abdominal aorta [facility])   [facility] Steele Sizer, MD      Future Appointments            In 3 months Ancil Boozer, Drue Stager, MD [facility] [place]   In 6 months  [facility]

## 2021-05-09 ENCOUNTER — Other Ambulatory Visit: Payer: Self-pay | Admitting: Family Medicine

## 2021-05-09 DIAGNOSIS — I10 Essential (primary) hypertension: Secondary | ICD-10-CM

## 2021-05-09 NOTE — Telephone Encounter (Signed)
Requested Prescriptions  Pending Prescriptions Disp Refills  . amLODipine (NORVASC) 5 MG tablet [Pharmacy Med Name: AMLODIPINE BESYLATE 5 MG TAB] 90 tablet 0    Sig: TAKE 1 TABLET (5 MG TOTAL) BY MOUTH DAILY.     Cardiovascular:  Calcium Channel Blockers Failed - 05/09/2021  1:19 AM      Failed - Last BP in normal range    BP Readings from Last 1 Encounters:  01/26/21 (!) 142/78         Passed - Valid encounter within last 6 months    Recent Outpatient Visits          3 months ago Essential hypertension   [facility], Drue Stager, MD   9 months ago Ectatic abdominal aorta [facility])   [facility] Steele Sizer, MD   1 year ago Dyslipidemia (high LDL; low HDL)   [facility] Steele Sizer, MD   1 year ago Vertigo   [facility] Steele Sizer, MD   1 year ago Ectatic abdominal aorta [facility])   [facility] Steele Sizer, MD      Future Appointments            In 2 months Ancil Boozer, Drue Stager, MD [facility] [place]   In 5 months  [facility]

## 2021-05-16 ENCOUNTER — Ambulatory Visit: Payer: Medicare PPO | Admitting: Family Medicine

## 2021-06-26 ENCOUNTER — Other Ambulatory Visit: Payer: Self-pay | Admitting: Family Medicine

## 2021-06-26 DIAGNOSIS — K219 Gastro-esophageal reflux disease without esophagitis: Secondary | ICD-10-CM

## 2021-06-26 DIAGNOSIS — H2513 Age-related nuclear cataract, bilateral: Secondary | ICD-10-CM | POA: Diagnosis not present

## 2021-07-12 DIAGNOSIS — D485 Neoplasm of uncertain behavior of skin: Secondary | ICD-10-CM | POA: Diagnosis not present

## 2021-07-12 DIAGNOSIS — D2262 Melanocytic nevi of left upper limb, including shoulder: Secondary | ICD-10-CM | POA: Diagnosis not present

## 2021-07-12 DIAGNOSIS — Z872 Personal history of diseases of the skin and subcutaneous tissue: Secondary | ICD-10-CM | POA: Diagnosis not present

## 2021-07-12 DIAGNOSIS — Z85828 Personal history of other malignant neoplasm of skin: Secondary | ICD-10-CM | POA: Diagnosis not present

## 2021-07-12 DIAGNOSIS — D0462 Carcinoma in situ of skin of left upper limb, including shoulder: Secondary | ICD-10-CM | POA: Diagnosis not present

## 2021-07-12 DIAGNOSIS — L570 Actinic keratosis: Secondary | ICD-10-CM | POA: Diagnosis not present

## 2021-07-12 DIAGNOSIS — D2261 Melanocytic nevi of right upper limb, including shoulder: Secondary | ICD-10-CM | POA: Diagnosis not present

## 2021-07-12 DIAGNOSIS — D2272 Melanocytic nevi of left lower limb, including hip: Secondary | ICD-10-CM | POA: Diagnosis not present

## 2021-07-12 DIAGNOSIS — Z09 Encounter for follow-up examination after completed treatment for conditions other than malignant neoplasm: Secondary | ICD-10-CM | POA: Diagnosis not present

## 2021-07-15 ENCOUNTER — Other Ambulatory Visit: Payer: Self-pay | Admitting: Family Medicine

## 2021-07-15 DIAGNOSIS — E785 Hyperlipidemia, unspecified: Secondary | ICD-10-CM

## 2021-07-15 DIAGNOSIS — I10 Essential (primary) hypertension: Secondary | ICD-10-CM

## 2021-07-15 DIAGNOSIS — G250 Essential tremor: Secondary | ICD-10-CM

## 2021-07-15 NOTE — Telephone Encounter (Signed)
Requested Prescriptions  Pending Prescriptions Disp Refills  . rosuvastatin (CRESTOR) 40 MG tablet [Pharmacy Med Name: ROSUVASTATIN CALCIUM 40 MG TAB] 90 tablet 1    Sig: TAKE 1 TABLET BY MOUTH EVERY DAY     Cardiovascular:  Antilipid - Statins Failed - 07/15/2021  3:23 PM      Failed - HDL in normal range and within 360 days    HDL  Date Value Ref Range Status  01/26/2021 34 (L) > OR = 40 mg/dL Final  04/23/2016 34 (L) >39 mg/dL Final         Passed - Total Cholesterol in normal range and within 360 days    Cholesterol, Total  Date Value Ref Range Status  04/23/2016 154 100 - 199 mg/dL Final   Cholesterol  Date Value Ref Range Status  01/26/2021 132 <200 mg/dL Final         Passed - LDL in normal range and within 360 days    LDL Cholesterol (Calc)  Date Value Ref Range Status  01/26/2021 78 mg/dL (calc) Final    Comment:    Reference range: <100 . Desirable range <100 mg/dL for primary prevention;   <70 mg/dL for patients with CHD or diabetic patients  with > or = 2 CHD risk factors. Marland Kitchen LDL-C is now calculated using the Martin-Hopkins  calculation, which is a validated novel method providing  better accuracy than the Friedewald equation in the  estimation of LDL-C.  Cresenciano Genre et al. Annamaria Helling. [id number]): 2061-2068  (http://education.[facility].com/faq/FAQ164)          Passed - Triglycerides in normal range and within 360 days    Triglycerides  Date Value Ref Range Status  01/26/2021 115 <150 mg/dL Final         Passed - Patient is not pregnant      Passed - Valid encounter within last 12 months    Recent Outpatient Visits          5 months ago Essential hypertension   [facility] Steele Sizer, MD   11 months ago Ectatic abdominal aorta [facility])   [facility] Steele Sizer, MD   1 year ago Dyslipidemia (high LDL; low HDL)   [facility] Steele Sizer, MD   1 year ago Vertigo   [facility] Steele Sizer, MD   1 year ago Ectatic abdominal aorta [facility])   [facility] Steele Sizer, MD      Future Appointments            In 2 weeks Steele Sizer, MD [facility], [place]   In 3 months  [facility], [place]           . propranolol ER (INDERAL LA) 60 MG 24 hr capsule [Pharmacy Med Name: PROPRANOLOL ER 60 MG CAPSULE] 90 capsule 0    Sig: TAKE 1 CAPSULE BY MOUTH EVERY DAY     Cardiovascular:  Beta Blockers Failed - 07/15/2021  3:23 PM      Failed - Last BP in normal range    BP Readings from Last 1 Encounters:  01/26/21 (!) 142/78         Passed - Last Heart Rate in normal range    Pulse Readings from Last 1 Encounters:  01/26/21 73         Passed - Valid encounter within last 6 months    Recent Outpatient Visits  5 months ago Essential hypertension   [facility] Steele Sizer, MD   11 months ago Ectatic abdominal aorta [facility])   [facility] Steele Sizer, MD   1 year ago Dyslipidemia (high LDL; low HDL)   [facility] Steele Sizer, MD   1 year ago Vertigo   [facility] Drue Stager, MD   1 year ago Ectatic abdominal aorta [facility])   [facility] Steele Sizer, MD      Future Appointments            In 2 weeks Steele Sizer, MD [facility], [place]   In 3 months  [place] (HYZAAR) 100-25 MG tablet [Pharmacy Med Name: LOSARTAN-HCTZ 100-25 MG TAB] 90 tablet 0    Sig: TAKE 1 TABLET BY MOUTH EVERY DAY     Cardiovascular: ARB + Diuretic Combos Failed - 07/15/2021  3:23 PM      Failed - Cr in normal range and within 180 days    Creat  Date Value Ref Range Status  01/26/2021 1.44 (H) 0.70 - 1.18 mg/dL Final    Comment:    For patients >66 years of age, the reference limit for  Creatinine is approximately 13% higher for people identified as African-American. .          Failed - Last BP in normal range    BP Readings from Last 1 Encounters:  01/26/21 (!) 142/78         Passed - K in normal range and within 180 days    Potassium  Date Value Ref Range Status  01/26/2021 4.2 3.5 - 5.3 mmol/L Final         Passed - Na in normal range and within 180 days    Sodium  Date Value Ref Range Status  01/26/2021 141 135 - 146 mmol/L Final  04/23/2016 141 134 - 144 mmol/L Final         Passed - Ca in normal range and within 180 days    Calcium  Date Value Ref Range Status  01/26/2021 9.9 8.6 - 10.3 mg/dL Final         Passed - Patient is not pregnant      Passed - Valid encounter within last 6 months    Recent Outpatient Visits          5 months ago Essential hypertension   [facility] Steele Sizer, MD   11 months ago Ectatic abdominal aorta [facility])   [facility] Steele Sizer, MD   1 year ago Dyslipidemia (high LDL; low HDL)   [facility] Steele Sizer, MD   1 year ago Vertigo   [facility] Steele Sizer, MD   1 year ago Ectatic abdominal aorta [facility])   [facility] Steele Sizer, MD      Future Appointments            In 2 weeks Steele Sizer, MD [facility], [place]   In 3 months  [facility], [facility]

## 2021-07-27 NOTE — Progress Notes (Signed)
Name: Zachary Meyer   MRN: [id number]    DOB: [birth year]   Date:07/31/2021       Progress Note  Subjective  Chief Complaint  Follow Up  HPI  HTN: He takes ARB, HCTZ and low dose Norvasc. He is on Propanolol 60 ER but was added by neurologist for essential tremors. BP today is at goal, he states at home bp is even better int he mid 120's  He denies chest pain, palpitation or SOB, no recent episodes of dizziness.   LUTS : discussed PSA but he is not interested, he would like to try medication, has nocturia about twice per night, also has some weak urinary stream    Dyslipidemia: no muscles pains, doing well on Rosuvastatin    CKI stage III: based on low GFR for years.   avoid nsaid's, he is drinking more water and avoiding sweet tea. He only takes Tylenol for pain prn. We will recheck labs today No pruritis    Hyperglycemia: no polyphagia, polydipsia or polyuria but he has urinary frequency, nocturia also. Last A1C in the pre diabetes range, discussed with patient   Tremors: chronic, seen by neurologist, aggravated when he uses weed eater, he is doing well on propranolol    Senile purpura: he has easy bruising and thin skin, reassurance given He takes aspirin, reassurance given   Aorta Ectasy: he is on statin therapy and baby aspirin, repeat AAA in 2024. Unchanged   AR: using flonase prn only, not having problems at this time   Patient Active Problem List   Diagnosis Date Noted   Personal history of colonic polyps    Benign neoplasm of ascending colon    Benign neoplasm of transverse colon    Polyp of sigmoid colon    Senile purpura ([employer]) 07/21/2018   Chronic kidney disease, stage III (moderate) (HCC) 07/21/2018   Vitamin D deficiency 07/21/2018   Hyperglycemia 01/23/2016   Hypertension 07/25/2015   GERD (gastroesophageal reflux disease) 07/25/2015   Hyperlipidemia 07/25/2015   Perennial allergic rhinitis 07/25/2015   Benign essential tremor 07/25/2015    Past Surgical  History:  Procedure Laterality Date   COLONOSCOPY WITH PROPOFOL     COLONOSCOPY WITH PROPOFOL N/A 10/13/2018   Procedure: COLONOSCOPY WITH PROPOFOL;  Surgeon: Lucilla Lame, MD;  Location: [facility] ENDOSCOPY;  Service: Endoscopy;  Laterality: N/A;   MELANOMA EXCISION Left 12/2012    Family History  Problem Relation Age of Onset   Hypertension Sister    Hypertension Brother    Heart attack Mother     Social History   Tobacco Use   Smoking status: Former    Packs/day: 0.50    Years: 10.00    Pack years: 5.00    Types: Cigarettes    Start date: 11/25/1953    Quit date: 01/22/1964    Years since quitting: 57.5   Smokeless tobacco: Never  Substance Use Topics   Alcohol use: No    Alcohol/week: 0.0 standard drinks     Current Outpatient Medications:    amLODipine (NORVASC) 5 MG tablet, TAKE 1 TABLET (5 MG TOTAL) BY MOUTH DAILY., Disp: 90 tablet, Rfl: 0   aspirin EC 81 MG tablet, Take 1 tablet (81 mg total) by mouth daily., Disp: 30 tablet, Rfl: 0   Cholecalciferol (VITAMIN D3) 50 MCG (2000 UT) TABS, Take 1 tablet by mouth daily., Disp: 30 tablet, Rfl: 0   fluticasone (FLONASE) 50 MCG/ACT nasal spray, SPRAY 2 SPRAYS INTO EACH NOSTRIL EVERY DAY, Disp: 48 mL,  Rfl: 2   losartan-hydrochlorothiazide (HYZAAR) 100-25 MG tablet, TAKE 1 TABLET BY MOUTH EVERY DAY, Disp: 90 tablet, Rfl: 0   omeprazole (PRILOSEC) 20 MG capsule, TAKE 1 CAPSULE BY MOUTH EVERY DAY, Disp: 90 capsule, Rfl: 1   propranolol ER (INDERAL LA) 60 MG 24 hr capsule, TAKE 1 CAPSULE BY MOUTH EVERY DAY, Disp: 90 capsule, Rfl: 0   rosuvastatin (CRESTOR) 40 MG tablet, TAKE 1 TABLET BY MOUTH EVERY DAY, Disp: 90 tablet, Rfl: 1  No Known Allergies  I personally reviewed active problem list, medication list, allergies, family history, social history, health maintenance with the patient/caregiver today.   ROS  Constitutional: Negative for fever or weight change.  Respiratory: Negative for cough and shortness of breath.    Cardiovascular: Negative for chest pain or palpitations.  Gastrointestinal: Negative for abdominal pain, no bowel changes.  Musculoskeletal: Negative for gait problem or joint swelling.  Skin: Negative for rash.  Neurological: Negative for dizziness or headache.  No other specific complaints in a complete review of systems (except as listed in HPI above).   Objective  Vitals:   07/31/21 0820  BP: 136/78  Pulse: 88  Resp: 16  Temp: 98.1 F (36.7 C)  SpO2: 96%  Weight: 210 lb (95.3 kg)  Height: 6' (1.829 m)    Body mass index is 28.48 kg/m.  Physical Exam  Constitutional: Patient appears well-developed and well-nourished. Overweight.  No distress.  HEENT: head atraumatic, normocephalic, pupils equal and reactive to light, neck supple Cardiovascular: Normal rate, regular rhythm and normal heart sounds.  No murmur heard. No BLE edema. Pulmonary/Chest: Effort normal and breath sounds normal. No respiratory distress. Abdominal: Soft.  There is no tenderness. Psychiatric: Patient has a normal mood and affect. behavior is normal. Judgment and thought content normal.    PHQ2/9: Depression screen [facility] 2/9 07/31/2021 01/26/2021 10/24/2020 07/28/2020 01/25/2020  Decreased Interest 0 0 0 0 0  Down, Depressed, Hopeless 0 0 0 0 0  PHQ - 2 Score 0 0 0 0 0  Altered sleeping - - - - 0  Tired, decreased energy - - - - 0  Change in appetite - - - - 0  Feeling bad or failure about yourself  - - - - 0  Trouble concentrating - - - - 0  Moving slowly or fidgety/restless - - - - 0  Suicidal thoughts - - - - 0  PHQ-9 Score - - - - 0  Difficult doing work/chores - - - - -  Some recent data might be hidden    phq 9 is negative   Fall Risk: Fall Risk  07/31/2021 01/26/2021 10/24/2020 07/28/2020 01/25/2020  Falls in the past year? 0 0 0 0 0  Number falls in past yr: 0 0 0 0 0  Injury with Fall? 0 0 0 0 0  Comment - - - - -  Risk for fall due to : No Fall Risks - No Fall Risks - -  Follow up Falls  prevention discussed - Falls prevention discussed - -      Functional Status Survey: Is the patient deaf or have difficulty hearing?: No Does the patient have difficulty seeing, even when wearing glasses/contacts?: No Does the patient have difficulty concentrating, remembering, or making decisions?: No Does the patient have difficulty walking or climbing stairs?: No Does the patient have difficulty dressing or bathing?: No Does the patient have difficulty doing errands alone such as visiting a doctor's office or shopping?: No    Assessment &  Plan  1. Ectatic abdominal aorta (HCC)  On statin and aspirin   2. Dyslipidemia (high LDL; low HDL)  - Lipid panel  3. Stage 3a chronic kidney disease (HCC)  - CBC with Differential/Platelet - COMPLETE METABOLIC PANEL WITH GFR - Microalbumin / creatinine urine ratio  4. Vitamin D deficiency  Continues supplementation   5. Senile purpura ([place])  Reassurance given   6. Benign essential tremor  - propranolol ER (INDERAL LA) 60 MG 24 hr capsule; Take 1 capsule (60 mg total) by mouth daily.  Dispense: 90 capsule; Refill: 1  7. GERD without esophagitis   8. Hyperglycemia  - Hemoglobin A1c  9. Need for immunization against influenza  - Flu Vaccine QUAD High Dose(Fluad)  10. Essential hypertension  - propranolol ER (INDERAL LA) 60 MG 24 hr capsule; Take 1 capsule (60 mg total) by mouth daily.  Dispense: 90 capsule; Refill: 1 - valsartan-hydrochlorothiazide (DIOVAN-HCT) 320-12.5 MG tablet; Take 1 tablet by mouth daily. In place of losartan hctz  Dispense: 90 tablet; Refill: 0  11. Benign hypertension with chronic kidney disease, stage III (HCC)  - valsartan-hydrochlorothiazide (DIOVAN-HCT) 320-12.5 MG tablet; Take 1 tablet by mouth daily. In place of losartan hctz  Dispense: 90 tablet; Refill: 0  12. Perennial allergic rhinitis  Using flonase prn   13. Lower urinary tract symptoms (LUTS)  - tamsulosin (FLOMAX) 0.4 MG  CAPS capsule; Take 1 capsule (0.4 mg total) by mouth daily.  Dispense: 90 capsule; Refill: 1

## 2021-07-31 ENCOUNTER — Encounter: Payer: Self-pay | Admitting: Family Medicine

## 2021-07-31 ENCOUNTER — Ambulatory Visit: Payer: Medicare PPO | Admitting: Family Medicine

## 2021-07-31 ENCOUNTER — Other Ambulatory Visit: Payer: Self-pay

## 2021-07-31 VITALS — BP 136/78 | HR 88 | Temp 98.1°F | Resp 16 | Ht 72.0 in | Wt 210.0 lb

## 2021-07-31 DIAGNOSIS — D692 Other nonthrombocytopenic purpura: Secondary | ICD-10-CM | POA: Diagnosis not present

## 2021-07-31 DIAGNOSIS — G250 Essential tremor: Secondary | ICD-10-CM | POA: Diagnosis not present

## 2021-07-31 DIAGNOSIS — Z23 Encounter for immunization: Secondary | ICD-10-CM | POA: Diagnosis not present

## 2021-07-31 DIAGNOSIS — I77811 Abdominal aortic ectasia: Secondary | ICD-10-CM

## 2021-07-31 DIAGNOSIS — N1831 Chronic kidney disease, stage 3a: Secondary | ICD-10-CM | POA: Diagnosis not present

## 2021-07-31 DIAGNOSIS — E785 Hyperlipidemia, unspecified: Secondary | ICD-10-CM

## 2021-07-31 DIAGNOSIS — K219 Gastro-esophageal reflux disease without esophagitis: Secondary | ICD-10-CM

## 2021-07-31 DIAGNOSIS — J3089 Other allergic rhinitis: Secondary | ICD-10-CM

## 2021-07-31 DIAGNOSIS — I129 Hypertensive chronic kidney disease with stage 1 through stage 4 chronic kidney disease, or unspecified chronic kidney disease: Secondary | ICD-10-CM

## 2021-07-31 DIAGNOSIS — N183 Chronic kidney disease, stage 3 unspecified: Secondary | ICD-10-CM

## 2021-07-31 DIAGNOSIS — I10 Essential (primary) hypertension: Secondary | ICD-10-CM

## 2021-07-31 DIAGNOSIS — R739 Hyperglycemia, unspecified: Secondary | ICD-10-CM | POA: Diagnosis not present

## 2021-07-31 DIAGNOSIS — E559 Vitamin D deficiency, unspecified: Secondary | ICD-10-CM | POA: Diagnosis not present

## 2021-07-31 DIAGNOSIS — R399 Unspecified symptoms and signs involving the genitourinary system: Secondary | ICD-10-CM

## 2021-07-31 MED ORDER — TAMSULOSIN HCL 0.4 MG PO CAPS: 0.4000 mg | ORAL_CAPSULE | Freq: Every day | ORAL | 1 refills | Status: DC

## 2021-07-31 MED ORDER — PROPRANOLOL HCL ER 60 MG PO CP24: 60.0000 mg | ORAL_CAPSULE | Freq: Every day | ORAL | 1 refills | Status: DC

## 2021-07-31 MED ORDER — VALSARTAN-HYDROCHLOROTHIAZIDE 320-12.5 MG PO TABS: 1.0000 | ORAL_TABLET | Freq: Every day | ORAL | 0 refills | Status: DC

## 2021-07-31 NOTE — Patient Instructions (Signed)
Two changes today:   Next prescription for BP medication will be called Valsartan hctz 320/125 mg. It will be in place of Losartan hctz 100/25   We are also adding flomax ( tamsulosin ) for urinary frequency

## 2021-08-01 LAB — HEMOGLOBIN A1C
Hgb A1c MFr Bld: 6.2 % of total Hgb — ABNORMAL HIGH (ref <5.7)
Mean Plasma Glucose: 131 mg/dL
eAG (mmol/L): 7.3 mmol/L

## 2021-08-01 LAB — CBC WITH DIFFERENTIAL/PLATELET
Absolute Monocytes: 632 cells/uL (ref 200–950)
Basophils Absolute: 40 cells/uL (ref 0–200)
Basophils Relative: 0.5 %
Eosinophils Absolute: 280 cells/uL (ref 15–500)
Eosinophils Relative: 3.5 %
HCT: 44.6 % (ref 38.5–50.0)
Hemoglobin: 14.6 g/dL (ref 13.2–17.1)
Lymphs Abs: 1160 cells/uL (ref 850–3900)
MCH: 30.6 pg (ref 27.0–33.0)
MCHC: 32.7 g/dL (ref 32.0–36.0)
MCV: 93.5 fL (ref 80.0–100.0)
MPV: 10.1 fL (ref 7.5–12.5)
Monocytes Relative: 7.9 %
Neutro Abs: 5888 cells/uL (ref 1500–7800)
Neutrophils Relative %: 73.6 %
Platelets: 247 10*3/uL (ref 140–400)
RBC: 4.77 10*6/uL (ref 4.20–5.80)
RDW: 11.8 % (ref 11.0–15.0)
Total Lymphocyte: 14.5 %
WBC: 8 10*3/uL (ref 3.8–10.8)

## 2021-08-01 LAB — MICROALBUMIN / CREATININE URINE RATIO
Creatinine, Urine: 94 mg/dL (ref 20–320)
Microalb Creat Ratio: 4 mcg/mg creat (ref <30)
Microalb, Ur: 0.4 mg/dL

## 2021-08-01 LAB — LIPID PANEL
Cholesterol: 131 mg/dL (ref <200)
HDL: 33 mg/dL — ABNORMAL LOW (ref >=40)
LDL Cholesterol (Calc): 75 mg/dL (calc)
Non-HDL Cholesterol (Calc): 98 mg/dL (calc) (ref <130)
Total CHOL/HDL Ratio: 4 (calc) (ref <5.0)
Triglycerides: 148 mg/dL (ref <150)

## 2021-08-01 LAB — COMPLETE METABOLIC PANEL WITH GFR
AG Ratio: 1.7 (calc) (ref 1.0–2.5)
ALT: 19 U/L (ref 9–46)
AST: 16 U/L (ref 10–35)
Albumin: 4.6 g/dL (ref 3.6–5.1)
Alkaline phosphatase (APISO): 76 U/L (ref 35–144)
BUN/Creatinine Ratio: 15 (calc) (ref 6–22)
BUN: 22 mg/dL (ref 7–25)
CO2: 32 mmol/L (ref 20–32)
Calcium: 9.8 mg/dL (ref 8.6–10.3)
Chloride: 101 mmol/L (ref 98–110)
Creat: 1.51 mg/dL — ABNORMAL HIGH (ref 0.70–1.28)
Globulin: 2.7 g/dL (calc) (ref 1.9–3.7)
Glucose, Bld: 117 mg/dL — ABNORMAL HIGH (ref 65–99)
Potassium: 4.5 mmol/L (ref 3.5–5.3)
Sodium: 139 mmol/L (ref 135–146)
Total Bilirubin: 0.6 mg/dL (ref 0.2–1.2)
Total Protein: 7.3 g/dL (ref 6.1–8.1)
eGFR: 47 mL/min/{1.73_m2} — ABNORMAL LOW (ref >=60)

## 2021-08-04 ENCOUNTER — Other Ambulatory Visit: Payer: Self-pay | Admitting: Family Medicine

## 2021-08-04 DIAGNOSIS — I10 Essential (primary) hypertension: Secondary | ICD-10-CM

## 2021-08-04 NOTE — Telephone Encounter (Signed)
Requested medications are due for refill today.  Unknown  Requested medications are on the active medications list.  yes  Last refill. 05/09/2021  Future visit scheduled.   yes  Notes to clinic.  Amlodipine is not mentioned as part of the therapy of essential hypertension from visit notes of 07/31/2021. Please advise.

## 2021-08-06 NOTE — Telephone Encounter (Signed)
Last seen 9.6.2022 next sch'd 12.1.2022

## 2021-08-19 ENCOUNTER — Other Ambulatory Visit: Payer: Self-pay | Admitting: Family Medicine

## 2021-08-19 DIAGNOSIS — J310 Chronic rhinitis: Secondary | ICD-10-CM

## 2021-08-19 NOTE — Telephone Encounter (Signed)
Requested medication (s) are due for refill today Yes  Requested medication (s) are on the active medication list Yes  Future visit scheduled yes 02/17/22  Note to clinic-prescription expired on 07/20/21. Routing to physician for current prescription.   Requested Prescriptions  Pending Prescriptions Disp Refills   fluticasone (FLONASE) 50 MCG/ACT nasal spray [Pharmacy Med Name: FLUTICASONE PROP 50 MCG SPRAY] 48 mL 2    Sig: SPRAY 2 SPRAYS INTO EACH NOSTRIL EVERY DAY     Ear, Nose, and Throat: Nasal Preparations - Corticosteroids Passed - 08/19/2021 12:48 AM      Passed - Valid encounter within last 12 months    Recent Outpatient Visits           2 weeks ago Ectatic abdominal aorta [facility])   [facility] Steele Sizer, MD   6 months ago Essential hypertension   [facility] Steele Sizer, MD   1 year ago Ectatic abdominal aorta [facility])   [facility] Steele Sizer, MD   1 year ago Dyslipidemia (high LDL; low HDL)   [facility] Steele Sizer, MD   1 year ago Vertigo   [facility] Steele Sizer, MD       Future Appointments             In 2 months  [facility], [place]   In 5 months Steele Sizer, MD [facility], [facility]

## 2021-08-20 DIAGNOSIS — D0462 Carcinoma in situ of skin of left upper limb, including shoulder: Secondary | ICD-10-CM | POA: Diagnosis not present

## 2021-10-23 ENCOUNTER — Other Ambulatory Visit: Payer: Self-pay | Admitting: Family Medicine

## 2021-10-23 DIAGNOSIS — G250 Essential tremor: Secondary | ICD-10-CM

## 2021-10-23 DIAGNOSIS — I129 Hypertensive chronic kidney disease with stage 1 through stage 4 chronic kidney disease, or unspecified chronic kidney disease: Secondary | ICD-10-CM

## 2021-10-23 DIAGNOSIS — I10 Essential (primary) hypertension: Secondary | ICD-10-CM

## 2021-10-25 ENCOUNTER — Ambulatory Visit (INDEPENDENT_AMBULATORY_CARE_PROVIDER_SITE_OTHER): Payer: Medicare PPO

## 2021-10-25 DIAGNOSIS — Z0000 Encounter for general adult medical examination without abnormal findings: Secondary | ICD-10-CM

## 2021-10-25 NOTE — Patient Instructions (Signed)
Zachary Meyer , Thank you for taking time to come for your Medicare Wellness Visit. I appreciate your ongoing commitment to your health goals. Please review the following plan we discussed and let me know if I can assist you in the future.   Screening recommendations/referrals: Colonoscopy: done 10/13/18. Repeat 09/2023 or as directed Recommended yearly ophthalmology/optometry visit for glaucoma screening and checkup Recommended yearly dental visit for hygiene and checkup  Vaccinations: Influenza vaccine: done 07/31/21 Pneumococcal vaccine: done 01/21/18 Tdap vaccine: due Shingles vaccine: Shingrix discussed. Please contact your pharmacy for coverage information.  Covid-19:  done 12/14/19, 01/08/20, 09/13/20, 03/22/21 & 09/20/21  Advanced directives: Please bring a copy of your health care power of attorney and living will to the office at your convenience.   Conditions/risks identified: Recommend drinking 6-8 glasses of water per day   Next appointment: Follow up in one year for your annual wellness visit.   Preventive Care 68 Years and Older, Male Preventive care refers to lifestyle choices and visits with your health care provider that can promote health and wellness. What does preventive care include? A yearly physical exam. This is also called an annual well check. Dental exams once or twice a year. Routine eye exams. Ask your health care provider how often you should have your eyes checked. Personal lifestyle choices, including: Daily care of your teeth and gums. Regular physical activity. Eating a healthy diet. Avoiding tobacco and drug use. Limiting alcohol use. Practicing safe sex. Taking low doses of aspirin every day. Taking vitamin and mineral supplements as recommended by your health care provider. What happens during an annual well check? The services and screenings done by your health care provider during your annual well check will depend on your age, overall health,  lifestyle risk factors, and family history of disease. Counseling  Your health care provider may ask you questions about your: Alcohol use. Tobacco use. Drug use. Emotional well-being. Home and relationship well-being. Sexual activity. Eating habits. History of falls. Memory and ability to understand (cognition). Work and work Statistician. Screening  You may have the following tests or measurements: Height, weight, and BMI. Blood pressure. Lipid and cholesterol levels. These may be checked every 5 years, or more frequently if you are over 41 years old. Skin check. Lung cancer screening. You may have this screening every year starting at age 36 if you have a 30-pack-year history of smoking and currently smoke or have quit within the past 15 years. Fecal occult blood test (FOBT) of the stool. You may have this test every year starting at age 37. Flexible sigmoidoscopy or colonoscopy. You may have a sigmoidoscopy every 5 years or a colonoscopy every 10 years starting at age 56. Prostate cancer screening. Recommendations will vary depending on your family history and other risks. Hepatitis C blood test. Hepatitis B blood test. Sexually transmitted disease (STD) testing. Diabetes screening. This is done by checking your blood sugar (glucose) after you have not eaten for a while (fasting). You may have this done every 1-3 years. Abdominal aortic aneurysm (AAA) screening. You may need this if you are a current or former smoker. Osteoporosis. You may be screened starting at age 18 if you are at high risk. Talk with your health care provider about your test results, treatment options, and if necessary, the need for more tests. Vaccines  Your health care provider may recommend certain vaccines, such as: Influenza vaccine. This is recommended every year. Tetanus, diphtheria, and acellular pertussis (Tdap, Td) vaccine. You may need  a Td booster every 10 years. Zoster vaccine. You may need this  after age 69. Pneumococcal 13-valent conjugate (PCV13) vaccine. One dose is recommended after age 57. Pneumococcal polysaccharide (PPSV23) vaccine. One dose is recommended after age 64. Talk to your health care provider about which screenings and vaccines you need and how often you need them. This information is not intended to replace advice given to you by your health care provider. Make sure you discuss any questions you have with your health care provider. Document Released: 12/08/2015 Document Revised: 07/31/2016 Document Reviewed: 09/12/2015 Elsevier Interactive Patient Education  2017 [place] Prevention in the Home Falls can cause injuries. They can happen to people of all ages. There are many things you can do to make your home safe and to help prevent falls. What can I do on the outside of my home? Regularly fix the edges of walkways and driveways and fix any cracks. Remove anything that might make you trip as you walk through a door, such as a raised step or threshold. Trim any bushes or trees on the path to your home. Use bright outdoor lighting. Clear any walking paths of anything that might make someone trip, such as rocks or tools. Regularly check to see if handrails are loose or broken. Make sure that both sides of any steps have handrails. Any raised decks and porches should have guardrails on the edges. Have any leaves, snow, or ice cleared regularly. Use sand or salt on walking paths during winter. Clean up any spills in your garage right away. This includes oil or grease spills. What can I do in the bathroom? Use night lights. Install grab bars by the toilet and in the tub and shower. Do not use towel bars as grab bars. Use non-skid mats or decals in the tub or shower. If you need to sit down in the shower, use a plastic, non-slip stool. Keep the floor dry. Clean up any water that spills on the floor as soon as it happens. Remove soap buildup in the tub or  shower regularly. Attach bath mats securely with double-sided non-slip rug tape. Do not have throw rugs and other things on the floor that can make you trip. What can I do in the bedroom? Use night lights. Make sure that you have a light by your bed that is easy to reach. Do not use any sheets or blankets that are too big for your bed. They should not hang down onto the floor. Have a firm chair that has side arms. You can use this for support while you get dressed. Do not have throw rugs and other things on the floor that can make you trip. What can I do in the kitchen? Clean up any spills right away. Avoid walking on wet floors. Keep items that you use a lot in easy-to-reach places. If you need to reach something above you, use a strong step stool that has a grab bar. Keep electrical cords out of the way. Do not use floor polish or wax that makes floors slippery. If you must use wax, use non-skid floor wax. Do not have throw rugs and other things on the floor that can make you trip. What can I do with my stairs? Do not leave any items on the stairs. Make sure that there are handrails on both sides of the stairs and use them. Fix handrails that are broken or loose. Make sure that handrails are as long as the stairways. Check  any carpeting to make sure that it is firmly attached to the stairs. Fix any carpet that is loose or worn. Avoid having throw rugs at the top or bottom of the stairs. If you do have throw rugs, attach them to the floor with carpet tape. Make sure that you have a light switch at the top of the stairs and the bottom of the stairs. If you do not have them, ask someone to add them for you. What else can I do to help prevent falls? Wear shoes that: Do not have high heels. Have rubber bottoms. Are comfortable and fit you well. Are closed at the toe. Do not wear sandals. If you use a stepladder: Make sure that it is fully opened. Do not climb a closed stepladder. Make  sure that both sides of the stepladder are locked into place. Ask someone to hold it for you, if possible. Clearly mark and make sure that you can see: Any grab bars or handrails. First and last steps. Where the edge of each step is. Use tools that help you move around (mobility aids) if they are needed. These include: Canes. Walkers. Scooters. Crutches. Turn on the lights when you go into a dark area. Replace any light bulbs as soon as they burn out. Set up your furniture so you have a clear path. Avoid moving your furniture around. If any of your floors are uneven, fix them. If there are any pets around you, be aware of where they are. Review your medicines with your doctor. Some medicines can make you feel dizzy. This can increase your chance of falling. Ask your doctor what other things that you can do to help prevent falls. This information is not intended to replace advice given to you by your health care provider. Make sure you discuss any questions you have with your health care provider. Document Released: 09/07/2009 Document Revised: 04/18/2016 Document Reviewed: 12/16/2014 Elsevier Interactive Patient Education  2017 [employer].

## 2021-10-25 NOTE — Progress Notes (Signed)
Subjective:   Zachary Meyer is a 80 y.o. male who presents for Medicare Annual/Subsequent preventive examination.  Virtual Visit via Telephone Note  I connected with  Zachary Meyer on 10/25/21 at  8:40 AM EST by telephone and verified that I am speaking with the correct person using two identifiers.  Location: Patient: home Provider: [place] Persons participating in the virtual visit: [place]   I discussed the limitations, risks, security and privacy concerns of performing an evaluation and management service by telephone and the availability of in person appointments. The patient expressed understanding and agreed to proceed.  Interactive audio and video telecommunications were attempted between this nurse and patient, however failed, due to patient having technical difficulties OR patient did not have access to video capability.  We continued and completed visit with audio only.  Some vital signs may be absent or patient reported.   Clemetine Marker, LPN   Review of Systems     Cardiac Risk Factors include: advanced age (>21men, >65 women);dyslipidemia;male gender;hypertension     Objective:    There were no vitals filed for this visit. There is no height or weight on file to calculate BMI.  Advanced Directives 10/25/2021 10/24/2020 10/15/2019 10/13/2018 09/19/2017 08/05/2017 07/18/2017  Does Patient Have a Medical Advance Directive? Yes Yes Yes Yes Yes Yes Yes  Type of Paramedic of [place];Living will [place];Living will [place];Living will - [place];Living will - -  Does patient want to make changes to medical advance directive? - - - - - - -  Copy of [place] in Chart? No - copy requested No - copy requested No - copy requested - No - copy requested - -    Current Medications (verified) Outpatient Encounter Medications as of 10/25/2021  Medication  Sig   amLODipine (NORVASC) 5 MG tablet TAKE 1 TABLET (5 MG TOTAL) BY MOUTH DAILY.   aspirin EC 81 MG tablet Take 1 tablet (81 mg total) by mouth daily.   Cholecalciferol (VITAMIN D3) 50 MCG (2000 UT) TABS Take 1 tablet by mouth daily.   fluticasone (FLONASE) 50 MCG/ACT nasal spray SPRAY 2 SPRAYS INTO EACH NOSTRIL EVERY DAY   omeprazole (PRILOSEC) 20 MG capsule TAKE 1 CAPSULE BY MOUTH EVERY DAY   propranolol ER (INDERAL LA) 60 MG 24 hr capsule Take 1 capsule (60 mg total) by mouth daily.   rosuvastatin (CRESTOR) 40 MG tablet TAKE 1 TABLET BY MOUTH EVERY DAY   tamsulosin (FLOMAX) 0.4 MG CAPS capsule Take 1 capsule (0.4 mg total) by mouth daily.   valsartan-hydrochlorothiazide (DIOVAN-HCT) 320-12.5 MG tablet TAKE 1 TABLET BY MOUTH DAILY. IN PLACE OF LOSARTAN HCTZ   No facility-administered encounter medications on file as of 10/25/2021.    Allergies (verified) Patient has no known allergies.   History: Past Medical History:  Diagnosis Date   Cancer ([place])    Skin Cancer ([place])   Chronic kidney disease, stage III (moderate) (HCC)    GERD (gastroesophageal reflux disease)    Hyperlipidemia    Hypertension    Past Surgical History:  Procedure Laterality Date   COLONOSCOPY WITH PROPOFOL     COLONOSCOPY WITH PROPOFOL N/A 10/13/2018   Procedure: COLONOSCOPY WITH PROPOFOL;  Surgeon: Lucilla Lame, MD;  Location: [facility] ENDOSCOPY;  Service: Endoscopy;  Laterality: N/A;   MELANOMA EXCISION Left 12/2012   Family History  Problem Relation Age of Onset   Hypertension Sister  Hypertension Brother    Heart attack Mother    Social History   Socioeconomic History   Marital status: Married    Spouse name: [name]    Number of children: 2   Years of education: Not on file   Highest education level: 10th grade  Occupational History   Occupation: driver     Comment: road crew for [employer]   Tobacco Use   Smoking status: Former    Packs/day: 0.50    Years: 10.00     Pack years: 5.00    Types: Cigarettes    Start date: 11/25/1953    Quit date: 01/22/1964    Years since quitting: 57.7   Smokeless tobacco: Never  Vaping Use   Vaping Use: Never used  Substance and Sexual Activity   Alcohol use: No    Alcohol/week: 0.0 standard drinks   Drug use: No   Sexual activity: Not Currently    Birth control/protection: None  Other Topics Concern   Not on file  Social History Narrative   Married, independent. Retired    Building services engineer once a week    He has 3 grand-children and one great-grandchild    Social Determinants of Radio broadcast assistant Strain: Low Risk    Difficulty of Paying Living Expenses: Not hard at all  Food Insecurity: No Food Insecurity   Worried About Charity fundraiser in the Last Year: Never true   Arboriculturist in the Last Year: Never true  Transportation Needs: No Transportation Needs   Lack of Transportation (Medical): No   Lack of Transportation (Non-Medical): No  Physical Activity: Insufficiently Active   Days of Exercise per Week: 3 days   Minutes of Exercise per Session: 30 min  Stress: No Stress Concern Present   Feeling of Stress : Not at all  Social Connections: Socially Integrated   Frequency of Communication with Friends and Family: More than three times a week   Frequency of Social Gatherings with Friends and Family: Three times a week   Attends Religious Services: More than 4 times per year   Active Member of Clubs or Organizations: Yes   Attends Music therapist: More than 4 times per year   Marital Status: Married    Tobacco Counseling Counseling given: Not Answered   Clinical Intake:  Pre-visit preparation completed: Yes  Pain : No/denies pain     Nutritional Risks: None Diabetes: No  How often do you need to have someone help you when you read instructions, pamphlets, or other written materials from your doctor or pharmacy?: 1 - Never    Interpreter Needed?: No  Information  entered by :: Clemetine Marker LPN   Activities of Daily Living In your present state of health, do you have any difficulty performing the following activities: 10/25/2021 07/31/2021  Hearing? N N  Vision? N N  Difficulty concentrating or making decisions? N N  Walking or climbing stairs? N N  Dressing or bathing? N N  Doing errands, shopping? N N  Preparing Food and eating ? N -  Using the Toilet? N -  In the past six months, have you accidently leaked urine? N -  Do you have problems with loss of bowel control? N -  Managing your Medications? N -  Managing your Finances? N -  Housekeeping or managing your Housekeeping? N -  Some recent data might be hidden    Patient Care Team: Steele Sizer, MD as PCP -  General (Family Medicine) Dasher, Rayvon Char, MD as Consulting Physician (Dermatology)  Indicate any recent Medical Services you may have received from other than [facility] providers in the past year (date may be approximate).     Assessment:   This is a routine wellness examination for Zachary Meyer.  Hearing/Vision screen Hearing Screening - Comments::  Pt denies hearing difficulty Vision Screening - Comments:: Annual vision screenings done at [facility] issues and exercise activities discussed: Current Exercise Habits: Home exercise routine, Type of exercise: walking;Other - see comments (golf), Time (Minutes): 30, Frequency (Times/Week): 3, Weekly Exercise (Minutes/Week): 90, Intensity: Mild, Exercise limited by: None identified   Goals Addressed             This Visit's Progress    Reduce portion size   On track    Recommend to reduce portion size of meals to 3 small healthy meals per day and 2 healthy snacks per day       Depression Screen PHQ 2/9 Scores 10/25/2021 07/31/2021 01/26/2021 10/24/2020 07/28/2020 01/25/2020 11/15/2019  PHQ - 2 Score 0 0 0 0 0 0 0  PHQ- 9 Score - - - - - 0 0    Fall Risk Fall Risk  10/25/2021 07/31/2021 01/26/2021 10/24/2020 07/28/2020  Falls  in the past year? 0 0 0 0 0  Number falls in past yr: 0 0 0 0 0  Injury with Fall? 0 0 0 0 0  Comment - - - - -  Risk for fall due to : No Fall Risks No Fall Risks - No Fall Risks -  Follow up Falls prevention discussed Falls prevention discussed - Falls prevention discussed -    FALL RISK PREVENTION PERTAINING TO THE HOME:  Any stairs in or around the home? Yes  If so, are there any without handrails? No  Home free of loose throw rugs in walkways, pet beds, electrical cords, etc? Yes  Adequate lighting in your home to reduce risk of falls? Yes   ASSISTIVE DEVICES UTILIZED TO PREVENT FALLS:  Life alert? No  Use of a cane, walker or w/c? No  Grab bars in the bathroom? No  Shower chair or bench in shower? No  Elevated toilet seat or a handicapped toilet? Yes   TIMED UP AND GO:  Was the test performed? No . Telephonic visit.   Cognitive Function: Normal cognitive status assessed by direct observation by this Nurse Health Advisor. No abnormalities found.       6CIT Screen 10/24/2020 10/15/2019 09/22/2018 09/19/2017  What Year? 0 points 0 points 0 points 0 points  What month? 0 points 0 points 0 points -  What time? 0 points 0 points 0 points 0 points  Count back from 20 0 points 0 points 0 points 0 points  Months in reverse 0 points 0 points 0 points 0 points  Repeat phrase 0 points 0 points 2 points 4 points  Total Score 0 0 2 -    Immunizations Immunization History  Administered Date(s) Administered   Fluad Quad(high Dose 65+) 07/23/2019, 07/28/2020, 07/31/2021   Influenza, High Dose Seasonal PF 08/27/2016, 09/08/2017, 09/22/2018   PFIZER Comirnaty(Gray Top)Covid-19 Tri-Sucrose Vaccine 03/22/2021   PFIZER(Purple Top)SARS-COV-2 Vaccination 12/14/2019, 01/08/2020, 09/13/2020   Pfizer Covid-19 Vaccine Bivalent Booster 74yrs & up 09/20/2021   Pneumococcal Conjugate-13 01/21/2018   Pneumococcal Polysaccharide-23 08/27/2016   Zoster, Live 10/24/2016    TDAP status: Due,  Education has been provided regarding the importance of this vaccine. Advised may receive  this vaccine at local pharmacy or Health Dept. Aware to provide a copy of the vaccination record if obtained from local pharmacy or Health Dept. Verbalized acceptance and understanding.  Flu Vaccine status: Up to date  Pneumococcal vaccine status: Up to date  Covid-19 vaccine status: Completed vaccines  Qualifies for Shingles Vaccine? Yes   Zostavax completed Yes   Shingrix Completed?: No.    Education has been provided regarding the importance of this vaccine. Patient has been advised to call insurance company to determine out of pocket expense if they have not yet received this vaccine. Advised may also receive vaccine at local pharmacy or Health Dept. Verbalized acceptance and understanding.  Screening Tests Health Maintenance  Topic Date Due   Zoster Vaccines- Shingrix (1 of 2) Never done   TETANUS/TDAP  01/26/2022 (Originally 11/05/1960)   COLONOSCOPY (Pts 45-70yrs Insurance coverage will need to be confirmed)  10/14/2023   Pneumonia Vaccine 47+ Years old  Completed   INFLUENZA VACCINE  Completed   COVID-19 Vaccine  Completed   Hepatitis C Screening  Completed   HPV VACCINES  Aged Out    Health Maintenance  Health Maintenance Due  Topic Date Due   Zoster Vaccines- Shingrix (1 of 2) Never done    Colorectal cancer screening: Type of screening: Colonoscopy. Completed 10/13/18. Repeat every 5 years  Lung Cancer Screening: (Low Dose CT Chest recommended if Age 61-80 years, 30 pack-year currently smoking OR have quit w/in 15years.) does not qualify.    Additional Screening:  Hepatitis C Screening: does qualify; Completed 07/28/20  Vision Screening: Recommended annual ophthalmology exams for early detection of glaucoma and other disorders of the eye. Is the patient up to date with their annual eye exam?  Yes  Who is the provider or what is the name of the office in which the patient  attends annual eye exams? Dr. Ellin Mayhew.   Dental Screening: Recommended annual dental exams for proper oral hygiene  Community Resource Referral / Chronic Care Management: CRR required this visit?  No   CCM required this visit?  No      Plan:     I have personally reviewed and noted the following in the patient's chart:   Medical and social history Use of alcohol, tobacco or illicit drugs  Current medications and supplements including opioid prescriptions. Patient is not currently taking opioid prescriptions. Functional ability and status Nutritional status Physical activity Advanced directives List of other physicians Hospitalizations, surgeries, and ER visits in previous 12 months Vitals Screenings to include cognitive, depression, and falls Referrals and appointments  In addition, I have reviewed and discussed with patient certain preventive protocols, quality metrics, and best practice recommendations. A written personalized care plan for preventive services as well as general preventive health recommendations were provided to patient.     Clemetine Marker, LPN   37/11/624   Nurse Notes: pt c/o cough/congestion for the last 2 weeks. Denies fever, body aches, chills or lost of taste or smell. Pt states his wife has same sxs and was negative for flu, covid and RSV. Pt taking tylenol and plans to take mucinex if needed. Pt advised to contact office if sxs worsen or persist

## 2021-10-26 ENCOUNTER — Other Ambulatory Visit: Payer: Self-pay | Admitting: Family Medicine

## 2021-10-26 DIAGNOSIS — G250 Essential tremor: Secondary | ICD-10-CM

## 2021-10-29 ENCOUNTER — Other Ambulatory Visit: Payer: Self-pay | Admitting: Family Medicine

## 2021-10-29 DIAGNOSIS — G250 Essential tremor: Secondary | ICD-10-CM

## 2022-01-15 DIAGNOSIS — C44519 Basal cell carcinoma of skin of other part of trunk: Secondary | ICD-10-CM | POA: Diagnosis not present

## 2022-01-15 DIAGNOSIS — D485 Neoplasm of uncertain behavior of skin: Secondary | ICD-10-CM | POA: Diagnosis not present

## 2022-01-15 DIAGNOSIS — Z85828 Personal history of other malignant neoplasm of skin: Secondary | ICD-10-CM | POA: Diagnosis not present

## 2022-01-15 DIAGNOSIS — L570 Actinic keratosis: Secondary | ICD-10-CM | POA: Diagnosis not present

## 2022-01-15 DIAGNOSIS — D2261 Melanocytic nevi of right upper limb, including shoulder: Secondary | ICD-10-CM | POA: Diagnosis not present

## 2022-01-15 DIAGNOSIS — L821 Other seborrheic keratosis: Secondary | ICD-10-CM | POA: Diagnosis not present

## 2022-01-15 DIAGNOSIS — D2262 Melanocytic nevi of left upper limb, including shoulder: Secondary | ICD-10-CM | POA: Diagnosis not present

## 2022-01-15 DIAGNOSIS — D225 Melanocytic nevi of trunk: Secondary | ICD-10-CM | POA: Diagnosis not present

## 2022-01-15 DIAGNOSIS — C44712 Basal cell carcinoma of skin of right lower limb, including hip: Secondary | ICD-10-CM | POA: Diagnosis not present

## 2022-01-17 ENCOUNTER — Other Ambulatory Visit: Payer: Self-pay | Admitting: Family Medicine

## 2022-01-17 DIAGNOSIS — E785 Hyperlipidemia, unspecified: Secondary | ICD-10-CM

## 2022-01-20 ENCOUNTER — Other Ambulatory Visit: Payer: Self-pay | Admitting: Family Medicine

## 2022-01-20 DIAGNOSIS — I129 Hypertensive chronic kidney disease with stage 1 through stage 4 chronic kidney disease, or unspecified chronic kidney disease: Secondary | ICD-10-CM

## 2022-01-20 DIAGNOSIS — N183 Chronic kidney disease, stage 3 unspecified: Secondary | ICD-10-CM

## 2022-01-20 DIAGNOSIS — I10 Essential (primary) hypertension: Secondary | ICD-10-CM

## 2022-01-20 DIAGNOSIS — K219 Gastro-esophageal reflux disease without esophagitis: Secondary | ICD-10-CM

## 2022-01-22 ENCOUNTER — Other Ambulatory Visit: Payer: Self-pay | Admitting: Family Medicine

## 2022-01-22 DIAGNOSIS — R399 Unspecified symptoms and signs involving the genitourinary system: Secondary | ICD-10-CM

## 2022-01-24 DIAGNOSIS — C44519 Basal cell carcinoma of skin of other part of trunk: Secondary | ICD-10-CM | POA: Diagnosis not present

## 2022-01-24 DIAGNOSIS — C44712 Basal cell carcinoma of skin of right lower limb, including hip: Secondary | ICD-10-CM | POA: Diagnosis not present

## 2022-01-25 NOTE — Progress Notes (Signed)
Name: Zachary Meyer   MRN: [id number]    DOB: 1941-05-31   Date:01/28/2022       Progress Note  Subjective  Chief Complaint  Follow Up  HPI  HTN: He takes ARB, HCTZ and low dose Norvasc. He is on Propanolol 60 ER but was added by neurologist for essential tremors. BP today is at goal, he states it dropped once at home, felt a little dizzy but resolved by itself and bp has been around 130/70's since. He denies chest pain, palpitation or SOB  URI: symptoms were nasal congestion, cough, but feeling much better now, it happened last week  LUTS : discussed PSA but he is not interested, he is now on Flomax, he states nocturia is down to once per night occasionally twice but sometimes does not get up at all, also has a stronger urine flow   Dyslipidemia: no muscles pains, doing well on Rosuvastatin . Reviewed last labs.    CKI stage III: based on low GFR for years, last level was 47 normal urine micro .  Reminded him to  avoid nsaid's, he is drinking more water and avoiding sweet tea. He only takes Tylenol for pain prn.    Hyperglycemia: no polyphagia, polydipsia or polyuria but he has urinary frequency, nocturia also. Last A1C was 6.2 % - trending up,  discussed with patient   Tremors: chronic, seen by neurologist, aggravated when he uses weed eater, he states symptoms are getting worse again, also triggered by eating ( left handed ) He will try using the right hand more often and go back to neurologist if worsening of symptoms . Denies side effects    Senile purpura: he has easy bruising and thin skin, reassurance given He takes aspirin  Aorta Ectasy: he is on statin therapy and baby aspirin, repeat AAA in 2024.  Unchanged   AR: using flonase prn only, not having problems at this time He still has medication at home   Vitamin D deficiency: continue otc supplementation   GERD: doing well on medication, advised to try to skip doses to see if symptoms still controlled   Patient Active  Problem List   Diagnosis Date Noted   Personal history of colonic polyps    Benign neoplasm of ascending colon    Benign neoplasm of transverse colon    Polyp of sigmoid colon    Senile purpura ([place]) 07/21/2018   Chronic kidney disease, stage III (moderate) ([place]) 07/21/2018   Vitamin D deficiency 07/21/2018   Hyperglycemia 01/23/2016   Hypertension 07/25/2015   GERD (gastroesophageal reflux disease) 07/25/2015   Hyperlipidemia 07/25/2015   Perennial allergic rhinitis 07/25/2015   Benign essential tremor 07/25/2015    Past Surgical History:  Procedure Laterality Date   COLONOSCOPY WITH PROPOFOL     COLONOSCOPY WITH PROPOFOL N/A 10/13/2018   Procedure: COLONOSCOPY WITH PROPOFOL;  Surgeon: Lucilla Lame, MD;  Location: [facility] ENDOSCOPY;  Service: Endoscopy;  Laterality: N/A;   MELANOMA EXCISION Left 12/2012    Family History  Problem Relation Age of Onset   Hypertension Sister    Hypertension Brother    Heart attack Mother     Social History   Tobacco Use   Smoking status: Former    Packs/day: 0.50    Years: 10.00    Pack years: 5.00    Types: Cigarettes    Start date: 11/25/1953    Quit date: 01/22/1964    Years since quitting: 58.0   Smokeless tobacco: Never  Substance Use Topics  Alcohol use: No    Alcohol/week: 0.0 standard drinks     Current Outpatient Medications:    amLODipine (NORVASC) 5 MG tablet, TAKE 1 TABLET (5 MG TOTAL) BY MOUTH DAILY., Disp: 90 tablet, Rfl: 1   aspirin EC 81 MG tablet, Take 1 tablet (81 mg total) by mouth daily., Disp: 30 tablet, Rfl: 0   Cholecalciferol (VITAMIN D3) 50 MCG (2000 UT) TABS, Take 1 tablet by mouth daily., Disp: 30 tablet, Rfl: 0   fluticasone (FLONASE) 50 MCG/ACT nasal spray, SPRAY 2 SPRAYS INTO EACH NOSTRIL EVERY DAY, Disp: 48 mL, Rfl: 2   omeprazole (PRILOSEC) 20 MG capsule, TAKE 1 CAPSULE BY MOUTH EVERY DAY, Disp: 90 capsule, Rfl: 1   propranolol ER (INDERAL LA) 60 MG 24 hr capsule, Take 1 capsule (60 mg total) by mouth  daily., Disp: 90 capsule, Rfl: 1   rosuvastatin (CRESTOR) 40 MG tablet, TAKE 1 TABLET BY MOUTH EVERY DAY, Disp: 90 tablet, Rfl: 1   tamsulosin (FLOMAX) 0.4 MG CAPS capsule, TAKE 1 CAPSULE BY MOUTH EVERY DAY, Disp: 90 capsule, Rfl: 0   valsartan-hydrochlorothiazide (DIOVAN-HCT) 320-12.5 MG tablet, TAKE 1 TABLET BY MOUTH DAILY. IN PLACE OF LOSARTAN HCTZ, Disp: 90 tablet, Rfl: 0  No Known Allergies  I personally reviewed active problem list, medication list, allergies, family history, social history, health maintenance with the patient/caregiver today.   ROS  Constitutional: Negative for fever or weight change.  Respiratory: Negative for cough and shortness of breath.   Cardiovascular: Negative for chest pain or palpitations.  Gastrointestinal: Negative for abdominal pain, no bowel changes.  Musculoskeletal: Negative for gait problem or joint swelling.  Skin: Negative for rash.  Neurological: Negative for dizziness or headache.  No other specific complaints in a complete review of systems (except as listed in HPI above).   Objective  Vitals:   01/28/22 0952  BP: 132/86  Pulse: 85  Resp: 16  SpO2: 95%  Weight: 213 lb (96.6 kg)  Height: 6' (1.829 m)    Body mass index is 28.89 kg/m.  Physical Exam  Constitutional: Patient appears well-developed and well-nourished.  No distress.  HEENT: head atraumatic, normocephalic, pupils equal and reactive to light, neck supple Cardiovascular: Normal rate, regular rhythm and normal heart sounds.  No murmur heard. No BLE edema. Pulmonary/Chest: Effort normal and breath sounds normal. No respiratory distress. Abdominal: Soft.  There is no tenderness. Psychiatric: Patient has a normal mood and affect. behavior is normal. Judgment and thought content normal.   PHQ2/9: Depression screen [facility] 2/9 01/28/2022 10/25/2021 07/31/2021 01/26/2021 10/24/2020  Decreased Interest 0 0 0 0 0  Down, Depressed, Hopeless 0 0 0 0 0  PHQ - 2 Score 0 0 0 0 0  Altered  sleeping 0 - - - -  Tired, decreased energy 0 - - - -  Change in appetite 0 - - - -  Feeling bad or failure about yourself  0 - - - -  Trouble concentrating 0 - - - -  Moving slowly or fidgety/restless 0 - - - -  Suicidal thoughts 0 - - - -  PHQ-9 Score 0 - - - -  Difficult doing work/chores - - - - -  Some recent data might be hidden    phq 9 is negative   Fall Risk: Fall Risk  01/28/2022 10/25/2021 07/31/2021 01/26/2021 10/24/2020  Falls in the past year? 0 0 0 0 0  Number falls in past yr: 0 0 0 0 0  Injury with Fall? 0  0 0 0 0  Comment - - - - -  Risk for fall due to : No Fall Risks No Fall Risks No Fall Risks - No Fall Risks  Follow up Falls prevention discussed Falls prevention discussed Falls prevention discussed - Falls prevention discussed      Functional Status Survey: Is the patient deaf or have difficulty hearing?: No Does the patient have difficulty seeing, even when wearing glasses/contacts?: No Does the patient have difficulty concentrating, remembering, or making decisions?: No Does the patient have difficulty walking or climbing stairs?: No Does the patient have difficulty dressing or bathing?: No Does the patient have difficulty doing errands alone such as visiting a doctor's office or shopping?: No    Assessment & Plan  1. Ectatic abdominal aorta ([place])   2. Senile purpura (HCC)   3. Stage 3a chronic kidney disease ([place])   4. Need for shingles vaccine  - Zoster Vaccine Adjuvanted [facility]) injection; Inject 0.5 mLs into the muscle once for 1 dose.  Dispense: 0.5 mL; Refill: 0  5. Need for Tdap vaccination  - Tdap (ADACEL) 03-26-14.5 LF-MCG/0.5 injection; Inject 0.5 mLs into the muscle once for 1 dose.  Dispense: 0.5 mL; Refill: 0  6. GERD without esophagitis   7. Benign hypertension with chronic kidney disease, stage III (HCC)  - valsartan-hydrochlorothiazide (DIOVAN-HCT) 320-12.5 MG tablet; Take 1 tablet by mouth daily. In place of losartan hctz   Dispense: 90 tablet; Refill: 1  8. Dyslipidemia (high LDL; low HDL)   9. Benign essential tremor  - propranolol ER (INDERAL LA) 60 MG 24 hr capsule; Take 1 capsule (60 mg total) by mouth daily.  Dispense: 90 capsule; Refill: 1  10 Lower urinary tract symptoms (LUTS)  - tamsulosin (FLOMAX) 0.4 MG CAPS capsule; Take 1 capsule (0.4 mg total) by mouth daily.  Dispense: 90 capsule; Refill: 1  11. Hyperglycemia   12. Perennial allergic rhinitis   13. Vitamin D deficiency   14. Essential hypertension  - valsartan-hydrochlorothiazide (DIOVAN-HCT) 320-12.5 MG tablet; Take 1 tablet by mouth daily. In place of losartan hctz  Dispense: 90 tablet; Refill: 1

## 2022-01-28 ENCOUNTER — Encounter: Payer: Self-pay | Admitting: Family Medicine

## 2022-01-28 ENCOUNTER — Ambulatory Visit: Payer: Medicare PPO | Admitting: Family Medicine

## 2022-01-28 VITALS — BP 132/86 | HR 85 | Resp 16 | Ht 72.0 in | Wt 213.0 lb

## 2022-01-28 DIAGNOSIS — E785 Hyperlipidemia, unspecified: Secondary | ICD-10-CM

## 2022-01-28 DIAGNOSIS — R739 Hyperglycemia, unspecified: Secondary | ICD-10-CM

## 2022-01-28 DIAGNOSIS — D692 Other nonthrombocytopenic purpura: Secondary | ICD-10-CM | POA: Diagnosis not present

## 2022-01-28 DIAGNOSIS — R399 Unspecified symptoms and signs involving the genitourinary system: Secondary | ICD-10-CM

## 2022-01-28 DIAGNOSIS — Z23 Encounter for immunization: Secondary | ICD-10-CM | POA: Diagnosis not present

## 2022-01-28 DIAGNOSIS — N1831 Chronic kidney disease, stage 3a: Secondary | ICD-10-CM | POA: Diagnosis not present

## 2022-01-28 DIAGNOSIS — I77811 Abdominal aortic ectasia: Secondary | ICD-10-CM

## 2022-01-28 DIAGNOSIS — K219 Gastro-esophageal reflux disease without esophagitis: Secondary | ICD-10-CM | POA: Diagnosis not present

## 2022-01-28 DIAGNOSIS — E559 Vitamin D deficiency, unspecified: Secondary | ICD-10-CM

## 2022-01-28 DIAGNOSIS — N183 Chronic kidney disease, stage 3 unspecified: Secondary | ICD-10-CM

## 2022-01-28 DIAGNOSIS — G250 Essential tremor: Secondary | ICD-10-CM

## 2022-01-28 DIAGNOSIS — J3089 Other allergic rhinitis: Secondary | ICD-10-CM

## 2022-01-28 DIAGNOSIS — I129 Hypertensive chronic kidney disease with stage 1 through stage 4 chronic kidney disease, or unspecified chronic kidney disease: Secondary | ICD-10-CM

## 2022-01-28 DIAGNOSIS — I10 Essential (primary) hypertension: Secondary | ICD-10-CM

## 2022-01-28 MED ORDER — VALSARTAN-HYDROCHLOROTHIAZIDE 320-12.5 MG PO TABS: 1.0000 | ORAL_TABLET | Freq: Every day | ORAL | 1 refills | Status: DC

## 2022-01-28 MED ORDER — PROPRANOLOL HCL ER 60 MG PO CP24: 60.0000 mg | ORAL_CAPSULE | Freq: Every day | ORAL | 1 refills | Status: DC

## 2022-01-28 MED ORDER — TAMSULOSIN HCL 0.4 MG PO CAPS: 0.4000 mg | ORAL_CAPSULE | Freq: Every day | ORAL | 1 refills | Status: DC

## 2022-01-28 MED ORDER — SHINGRIX 50 MCG/0.5ML IM SUSR: 0.5000 mL | Freq: Once | INTRAMUSCULAR | 0 refills | Status: AC

## 2022-01-28 MED ORDER — TETANUS-DIPHTH-ACELL PERTUSSIS 5-2-15.5 LF-MCG/0.5 IM SUSP: 0.5000 mL | Freq: Once | INTRAMUSCULAR | 0 refills | Status: AC

## 2022-06-21 ENCOUNTER — Other Ambulatory Visit: Payer: Self-pay | Admitting: Family Medicine

## 2022-06-21 DIAGNOSIS — I10 Essential (primary) hypertension: Secondary | ICD-10-CM

## 2022-06-27 DIAGNOSIS — H2513 Age-related nuclear cataract, bilateral: Secondary | ICD-10-CM | POA: Diagnosis not present

## 2022-07-10 ENCOUNTER — Other Ambulatory Visit: Payer: Self-pay | Admitting: Family Medicine

## 2022-07-10 DIAGNOSIS — E785 Hyperlipidemia, unspecified: Secondary | ICD-10-CM

## 2022-07-15 ENCOUNTER — Other Ambulatory Visit: Payer: Self-pay | Admitting: Family Medicine

## 2022-07-15 DIAGNOSIS — K219 Gastro-esophageal reflux disease without esophagitis: Secondary | ICD-10-CM

## 2022-07-31 DIAGNOSIS — D2272 Melanocytic nevi of left lower limb, including hip: Secondary | ICD-10-CM | POA: Diagnosis not present

## 2022-07-31 DIAGNOSIS — X32XXXA Exposure to sunlight, initial encounter: Secondary | ICD-10-CM | POA: Diagnosis not present

## 2022-07-31 DIAGNOSIS — D2262 Melanocytic nevi of left upper limb, including shoulder: Secondary | ICD-10-CM | POA: Diagnosis not present

## 2022-07-31 DIAGNOSIS — D2261 Melanocytic nevi of right upper limb, including shoulder: Secondary | ICD-10-CM | POA: Diagnosis not present

## 2022-07-31 DIAGNOSIS — C44619 Basal cell carcinoma of skin of left upper limb, including shoulder: Secondary | ICD-10-CM | POA: Diagnosis not present

## 2022-07-31 DIAGNOSIS — D485 Neoplasm of uncertain behavior of skin: Secondary | ICD-10-CM | POA: Diagnosis not present

## 2022-07-31 DIAGNOSIS — L821 Other seborrheic keratosis: Secondary | ICD-10-CM | POA: Diagnosis not present

## 2022-07-31 DIAGNOSIS — Z85828 Personal history of other malignant neoplasm of skin: Secondary | ICD-10-CM | POA: Diagnosis not present

## 2022-07-31 DIAGNOSIS — L570 Actinic keratosis: Secondary | ICD-10-CM | POA: Diagnosis not present

## 2022-07-31 NOTE — Progress Notes (Signed)
Name: Zachary Meyer   MRN: [id number]    DOB: 01-31-1941   Date:08/01/2022       Progress Note  Subjective  Chief Complaint  Follow Up  HPI  HTN: He takes ARB, HCTZ and low dose Norvasc. He is on Propanolol 60 ER but was added by neurologist for essential tremors. BP today is at goals Denies chest pain or palpitation Occasionally has dizziness when he leans forwards   URI: started two weeks ago with rhinorrhea and a dry cough, no sob or wheezing, no fever or chills. He is feeling better, advised mucinex dm if needed.   LUTS : discussed PSA but he is not interested, he is now on Flomax, he states nocturia is down to once per night occasionally twice . Good urine flow    Dyslipidemia: no muscles pains, doing well on Rosuvastatin . Reviewed last labs.    CKI stage III: based on low GFR for years, last level was 47 normal urine micro .  Reminded him to  avoid nsaid's, he is drinking more water and avoiding sweet tea. He only takes Tylenol for pain prn.    Hyperglycemia: no polyphagia, polydipsia or polyuria but he has urinary frequency, nocturia also. Last A1C was 6.2 % - trending up,  he is trying to eat healthy   Tremors: chronic, seen by neurologist, aggravated when he uses weed eater, he states symptoms are getting worse again, also triggered by eating ( left handed ) he states symptoms are back to baseline    Senile purpura: stable. He takes aspirin  Aorta Ectasy: he is on statin therapy and baby aspirin, repeat AAA in 2024. Unchanged   AR: using flonase prn only, he has intermittent symptoms of nasal congestion and rhinorrhea He still has medication at home   Vitamin D deficiency: continue otc supplementation   GERD: doing well on medication, he has not tried skipping doses yet   Patient Active Problem List   Diagnosis Date Noted   Personal history of colonic polyps    Benign neoplasm of ascending colon    Benign neoplasm of transverse colon    Polyp of sigmoid colon     Senile purpura ([place]) 07/21/2018   Chronic kidney disease, stage III (moderate) ([place]) 07/21/2018   Vitamin D deficiency 07/21/2018   Hyperglycemia 01/23/2016   Hypertension 07/25/2015   GERD (gastroesophageal reflux disease) 07/25/2015   Hyperlipidemia 07/25/2015   Perennial allergic rhinitis 07/25/2015   Benign essential tremor 07/25/2015    Past Surgical History:  Procedure Laterality Date   COLONOSCOPY WITH PROPOFOL     COLONOSCOPY WITH PROPOFOL N/A 10/13/2018   Procedure: COLONOSCOPY WITH PROPOFOL;  Surgeon: Lucilla Lame, MD;  Location: [facility] ENDOSCOPY;  Service: Endoscopy;  Laterality: N/A;   MELANOMA EXCISION Left 12/2012    Family History  Problem Relation Age of Onset   Hypertension Sister    Hypertension Brother    Heart attack Mother     Social History   Tobacco Use   Smoking status: Former    Packs/day: 0.50    Years: 10.00    Total pack years: 5.00    Types: Cigarettes    Start date: 11/25/1953    Quit date: 01/22/1964    Years since quitting: 58.5   Smokeless tobacco: Never  Substance Use Topics   Alcohol use: No    Alcohol/week: 0.0 standard drinks of alcohol     Current Outpatient Medications:    amLODipine (NORVASC) 5 MG tablet, TAKE 1 TABLET (5  MG TOTAL) BY MOUTH DAILY., Disp: 90 tablet, Rfl: 0   aspirin EC 81 MG tablet, Take 1 tablet (81 mg total) by mouth daily., Disp: 30 tablet, Rfl: 0   Cholecalciferol (VITAMIN D3) 50 MCG (2000 UT) TABS, Take 1 tablet by mouth daily., Disp: 30 tablet, Rfl: 0   fluticasone (FLONASE) 50 MCG/ACT nasal spray, SPRAY 2 SPRAYS INTO EACH NOSTRIL EVERY DAY, Disp: 48 mL, Rfl: 2   omeprazole (PRILOSEC) 20 MG capsule, TAKE 1 CAPSULE BY MOUTH EVERY DAY, Disp: 90 capsule, Rfl: 0   propranolol ER (INDERAL LA) 60 MG 24 hr capsule, Take 1 capsule (60 mg total) by mouth daily., Disp: 90 capsule, Rfl: 1   rosuvastatin (CRESTOR) 40 MG tablet, TAKE 1 TABLET BY MOUTH EVERY DAY, Disp: 90 tablet, Rfl: 1   tamsulosin (FLOMAX) 0.4 MG CAPS  capsule, Take 1 capsule (0.4 mg total) by mouth daily., Disp: 90 capsule, Rfl: 1   valsartan-hydrochlorothiazide (DIOVAN-HCT) 320-12.5 MG tablet, Take 1 tablet by mouth daily. In place of losartan hctz, Disp: 90 tablet, Rfl: 1  No Known Allergies  I personally reviewed active problem list, medication list, allergies, family history, social history, health maintenance with the patient/caregiver today.   ROS  Constitutional: Negative for fever or weight change.  Respiratory: positive for cough but no  shortness of breath.   Cardiovascular: Negative for chest pain or palpitations.  Gastrointestinal: Negative for abdominal pain, no bowel changes.  Musculoskeletal: Negative for gait problem or joint swelling.  Skin: Negative for rash.  Neurological: Negative for dizziness or headache.  No other specific complaints in a complete review of systems (except as listed in HPI above).   Objective  Vitals:   08/01/22 0936  BP: 138/76  Pulse: 76  Resp: 16  SpO2: 96%  Weight: 209 lb (94.8 kg)  Height: 6' (1.829 m)    Body mass index is 28.35 kg/m.  Physical Exam  Constitutional: Patient appears well-developed and well-nourished. Obese  No distress.  HEENT: head atraumatic, normocephalic, pupils equal and reactive to light, neck supple, throat within normal limits Cardiovascular: Normal rate, regular rhythm and normal heart sounds.  No murmur heard. No BLE edema. Pulmonary/Chest: Effort normal and breath sounds normal. No respiratory distress. Abdominal: Soft.  There is no tenderness. Psychiatric: Patient has a normal mood and affect. behavior is normal. Judgment and thought content normal.    PHQ2/9:    08/01/2022    9:37 AM 01/28/2022    9:51 AM 10/25/2021    8:40 AM 07/31/2021    8:20 AM 01/26/2021    9:51 AM  Depression screen PHQ 2/9  Decreased Interest 0 0 0 0 0  Down, Depressed, Hopeless 0 0 0 0 0  PHQ - 2 Score 0 0 0 0 0  Altered sleeping 0 0     Tired, decreased energy 0 0      Change in appetite 0 0     Feeling bad or failure about yourself  0 0     Trouble concentrating 0 0     Moving slowly or fidgety/restless 0 0     Suicidal thoughts 0 0     PHQ-9 Score 0 0       phq 9 is negative   Fall Risk:    08/01/2022    9:37 AM 01/28/2022    9:51 AM 10/25/2021    8:42 AM 07/31/2021    8:20 AM 01/26/2021    9:51 AM  Fall Risk   Falls in the  past year? 0 0 0 0 0  Number falls in past yr: 0 0 0 0 0  Injury with Fall? 0 0 0 0 0  Risk for fall due to : No Fall Risks No Fall Risks No Fall Risks No Fall Risks   Follow up Falls prevention discussed Falls prevention discussed Falls prevention discussed Falls prevention discussed       Functional Status Survey: Is the patient deaf or have difficulty hearing?: No Does the patient have difficulty seeing, even when wearing glasses/contacts?: No Does the patient have difficulty concentrating, remembering, or making decisions?: No Does the patient have difficulty walking or climbing stairs?: No Does the patient have difficulty dressing or bathing?: No Does the patient have difficulty doing errands alone such as visiting a doctor's office or shopping?: No    Assessment & Plan  1. Benign hypertension with chronic kidney disease, stage III (HCC)  - valsartan-hydrochlorothiazide (DIOVAN-HCT) 320-12.5 MG tablet; Take 1 tablet by mouth daily. In place of losartan hctz  Dispense: 90 tablet; Refill: 1  2. Stage 3a chronic kidney disease ([place])  Recheck today   3. Ectatic abdominal aorta (HCC)  Repeat doppler in 2024   4. Essential hypertension  - CBC with Differential/Platelet - COMPLETE METABOLIC PANEL WITH GFR - amLODipine (NORVASC) 5 MG tablet; Take 1 tablet (5 mg total) by mouth daily.  Dispense: 90 tablet; Refill: 1 - valsartan-hydrochlorothiazide (DIOVAN-HCT) 320-12.5 MG tablet; Take 1 tablet by mouth daily. In place of losartan hctz  Dispense: 90 tablet; Refill: 1  5. Chronic rhinitis   6. Dyslipidemia  (high LDL; low HDL)  - Lipid panel  7. GERD without esophagitis  - omeprazole (PRILOSEC) 20 MG capsule; TAKE 1 CAPSULE BY MOUTH EVERY DAY  Dispense: 90 capsule; Refill: 0  8. Benign essential tremor  - propranolol ER (INDERAL LA) 60 MG 24 hr capsule; Take 1 capsule (60 mg total) by mouth daily.  Dispense: 90 capsule; Refill: 1  9. Lower urinary tract symptoms (LUTS)  - tamsulosin (FLOMAX) 0.4 MG CAPS capsule; Take 1 capsule (0.4 mg total) by mouth daily.  Dispense: 90 capsule; Refill: 1  10. Senile purpura ([place])   11. Need for immunization against influenza  - Flu Vaccine QUAD High Dose(Fluad)  12. Vitamin D deficiency   13. Need for Tdap vaccination  - Tdap (ADACEL) 03-26-14.5 LF-MCG/0.5 injection; Inject 0.5 mLs into the muscle once for 1 dose.  Dispense: 0.5 mL; Refill: 0  14. Need for shingles vaccine  - Zoster Vaccine Adjuvanted [facility]) injection; Inject 0.5 mLs into the muscle once for 1 dose.  Dispense: 0.5 mL; Refill: 0  15. Hyperglycemia  - Hemoglobin A1c

## 2022-08-01 ENCOUNTER — Ambulatory Visit: Payer: Medicare PPO | Admitting: Family Medicine

## 2022-08-01 ENCOUNTER — Encounter: Payer: Self-pay | Admitting: Family Medicine

## 2022-08-01 VITALS — BP 138/76 | HR 76 | Resp 16 | Ht 72.0 in | Wt 209.0 lb

## 2022-08-01 DIAGNOSIS — I77811 Abdominal aortic ectasia: Secondary | ICD-10-CM

## 2022-08-01 DIAGNOSIS — N183 Chronic kidney disease, stage 3 unspecified: Secondary | ICD-10-CM

## 2022-08-01 DIAGNOSIS — R399 Unspecified symptoms and signs involving the genitourinary system: Secondary | ICD-10-CM

## 2022-08-01 DIAGNOSIS — K219 Gastro-esophageal reflux disease without esophagitis: Secondary | ICD-10-CM | POA: Diagnosis not present

## 2022-08-01 DIAGNOSIS — Z23 Encounter for immunization: Secondary | ICD-10-CM | POA: Diagnosis not present

## 2022-08-01 DIAGNOSIS — I129 Hypertensive chronic kidney disease with stage 1 through stage 4 chronic kidney disease, or unspecified chronic kidney disease: Secondary | ICD-10-CM

## 2022-08-01 DIAGNOSIS — I10 Essential (primary) hypertension: Secondary | ICD-10-CM | POA: Diagnosis not present

## 2022-08-01 DIAGNOSIS — N1831 Chronic kidney disease, stage 3a: Secondary | ICD-10-CM

## 2022-08-01 DIAGNOSIS — G250 Essential tremor: Secondary | ICD-10-CM | POA: Diagnosis not present

## 2022-08-01 DIAGNOSIS — E559 Vitamin D deficiency, unspecified: Secondary | ICD-10-CM

## 2022-08-01 DIAGNOSIS — E785 Hyperlipidemia, unspecified: Secondary | ICD-10-CM

## 2022-08-01 DIAGNOSIS — R739 Hyperglycemia, unspecified: Secondary | ICD-10-CM | POA: Diagnosis not present

## 2022-08-01 DIAGNOSIS — J310 Chronic rhinitis: Secondary | ICD-10-CM

## 2022-08-01 DIAGNOSIS — D692 Other nonthrombocytopenic purpura: Secondary | ICD-10-CM

## 2022-08-01 MED ORDER — TETANUS-DIPHTH-ACELL PERTUSSIS 5-2-15.5 LF-MCG/0.5 IM SUSP: 0.5000 mL | Freq: Once | INTRAMUSCULAR | 0 refills | Status: AC

## 2022-08-01 MED ORDER — SHINGRIX 50 MCG/0.5ML IM SUSR: 0.5000 mL | Freq: Once | INTRAMUSCULAR | 0 refills | Status: AC

## 2022-08-01 MED ORDER — PROPRANOLOL HCL ER 60 MG PO CP24: 60.0000 mg | ORAL_CAPSULE | Freq: Every day | ORAL | 1 refills | Status: DC

## 2022-08-01 MED ORDER — AMLODIPINE BESYLATE 5 MG PO TABS: 5.0000 mg | ORAL_TABLET | Freq: Every day | ORAL | 1 refills | Status: DC

## 2022-08-01 MED ORDER — VALSARTAN-HYDROCHLOROTHIAZIDE 320-12.5 MG PO TABS: 1.0000 | ORAL_TABLET | Freq: Every day | ORAL | 1 refills | Status: DC

## 2022-08-01 MED ORDER — TAMSULOSIN HCL 0.4 MG PO CAPS: 0.4000 mg | ORAL_CAPSULE | Freq: Every day | ORAL | 1 refills | Status: DC

## 2022-08-01 MED ORDER — OMEPRAZOLE 20 MG PO CPDR: DELAYED_RELEASE_CAPSULE | ORAL | 0 refills | Status: DC

## 2022-08-01 NOTE — Patient Instructions (Signed)
Tdap Shingrix COVID Booster- Oct. RSV

## 2022-08-02 LAB — LIPID PANEL
Cholesterol: 124 mg/dL (ref <200)
HDL: 35 mg/dL — ABNORMAL LOW (ref >=40)
LDL Cholesterol (Calc): 72 mg/dL (calc)
Non-HDL Cholesterol (Calc): 89 mg/dL (calc) (ref <130)
Total CHOL/HDL Ratio: 3.5 (calc) (ref <5.0)
Triglycerides: 86 mg/dL (ref <150)

## 2022-08-02 LAB — COMPLETE METABOLIC PANEL WITH GFR
AG Ratio: 1.9 (calc) (ref 1.0–2.5)
ALT: 16 U/L (ref 9–46)
AST: 13 U/L (ref 10–35)
Albumin: 4.5 g/dL (ref 3.6–5.1)
Alkaline phosphatase (APISO): 76 U/L (ref 35–144)
BUN/Creatinine Ratio: 13 (calc) (ref 6–22)
BUN: 22 mg/dL (ref 7–25)
CO2: 31 mmol/L (ref 20–32)
Calcium: 9.8 mg/dL (ref 8.6–10.3)
Chloride: 105 mmol/L (ref 98–110)
Creat: 1.66 mg/dL — ABNORMAL HIGH (ref 0.70–1.28)
Globulin: 2.4 g/dL (calc) (ref 1.9–3.7)
Glucose, Bld: 96 mg/dL (ref 65–99)
Potassium: 5.1 mmol/L (ref 3.5–5.3)
Sodium: 141 mmol/L (ref 135–146)
Total Bilirubin: 0.4 mg/dL (ref 0.2–1.2)
Total Protein: 6.9 g/dL (ref 6.1–8.1)
eGFR: 42 mL/min/{1.73_m2} — ABNORMAL LOW (ref >=60)

## 2022-08-02 LAB — HEMOGLOBIN A1C
Hgb A1c MFr Bld: 5.8 % of total Hgb — ABNORMAL HIGH (ref <5.7)
Mean Plasma Glucose: 120 mg/dL
eAG (mmol/L): 6.6 mmol/L

## 2022-08-02 LAB — CBC WITH DIFFERENTIAL/PLATELET
Absolute Monocytes: 584 cells/uL (ref 200–950)
Basophils Absolute: 40 cells/uL (ref 0–200)
Basophils Relative: 0.5 %
Eosinophils Absolute: 168 cells/uL (ref 15–500)
Eosinophils Relative: 2.1 %
HCT: 40.3 % (ref 38.5–50.0)
Hemoglobin: 13.3 g/dL (ref 13.2–17.1)
Lymphs Abs: 1152 cells/uL (ref 850–3900)
MCH: 31.2 pg (ref 27.0–33.0)
MCHC: 33 g/dL (ref 32.0–36.0)
MCV: 94.6 fL (ref 80.0–100.0)
MPV: 10.3 fL (ref 7.5–12.5)
Monocytes Relative: 7.3 %
Neutro Abs: 6056 cells/uL (ref 1500–7800)
Neutrophils Relative %: 75.7 %
Platelets: 228 10*3/uL (ref 140–400)
RBC: 4.26 10*6/uL (ref 4.20–5.80)
RDW: 11.7 % (ref 11.0–15.0)
Total Lymphocyte: 14.4 %
WBC: 8 10*3/uL (ref 3.8–10.8)

## 2022-09-18 DIAGNOSIS — C44619 Basal cell carcinoma of skin of left upper limb, including shoulder: Secondary | ICD-10-CM | POA: Diagnosis not present

## 2022-10-29 ENCOUNTER — Ambulatory Visit (INDEPENDENT_AMBULATORY_CARE_PROVIDER_SITE_OTHER): Payer: Medicare PPO

## 2022-10-29 VITALS — Ht 72.0 in | Wt 209.0 lb

## 2022-10-29 DIAGNOSIS — Z0000 Encounter for general adult medical examination without abnormal findings: Secondary | ICD-10-CM | POA: Diagnosis not present

## 2022-10-29 NOTE — Progress Notes (Signed)
Subjective:  I connected with  Tyron Russell on 10/29/22 by a video and audio enabled telemedicine application and verified that I am speaking with the correct person using two identifiers.  Patient Location: Home  Provider Location: Office/Clinic  I discussed the limitations of evaluation and management by telemedicine. The patient expressed understanding and agreed to proceed.  Dervin Vore Zeek is a 81 y.o. male who presents for Medicare Annual/Subsequent preventive examination.  Review of Systems    Defer to PCP       Objective:    There were no vitals filed for this visit. There is no height or weight on file to calculate BMI.     10/25/2021    8:41 AM 10/24/2020    8:52 AM 10/15/2019   10:12 AM 10/13/2018    9:50 AM 09/19/2017   10:03 AM 08/05/2017    2:31 PM 07/18/2017    9:24 AM  Advanced Directives  Does Patient Have a Medical Advance Directive? Yes Yes Yes Yes Yes Yes Yes  Type of Paramedic of [place];Living will [place];Living will [place];Living will  [place];Living will    Copy of [place] in Chart? No - copy requested No - copy requested No - copy requested  No - copy requested      Current Medications (verified) Outpatient Encounter Medications as of 10/29/2022  Medication Sig   amLODipine (NORVASC) 5 MG tablet Take 1 tablet (5 mg total) by mouth daily.   aspirin EC 81 MG tablet Take 1 tablet (81 mg total) by mouth daily.   Cholecalciferol (VITAMIN D3) 50 MCG (2000 UT) TABS Take 1 tablet by mouth daily.   fluticasone (FLONASE) 50 MCG/ACT nasal spray SPRAY 2 SPRAYS INTO EACH NOSTRIL EVERY DAY   omeprazole (PRILOSEC) 20 MG capsule TAKE 1 CAPSULE BY MOUTH EVERY DAY   propranolol ER (INDERAL LA) 60 MG 24 hr capsule Take 1 capsule (60 mg total) by mouth daily.   rosuvastatin (CRESTOR) 40 MG tablet TAKE 1 TABLET BY MOUTH EVERY DAY   tamsulosin (FLOMAX) 0.4  MG CAPS capsule Take 1 capsule (0.4 mg total) by mouth daily.   valsartan-hydrochlorothiazide (DIOVAN-HCT) 320-12.5 MG tablet Take 1 tablet by mouth daily. In place of losartan hctz   No facility-administered encounter medications on file as of 10/29/2022.    Allergies (verified) Patient has no known allergies.   History: Past Medical History:  Diagnosis Date   Cancer (Home)    Skin Cancer ([place])   Chronic kidney disease, stage III (moderate) (HCC)    GERD (gastroesophageal reflux disease)    Hyperlipidemia    Hypertension    Past Surgical History:  Procedure Laterality Date   COLONOSCOPY WITH PROPOFOL     COLONOSCOPY WITH PROPOFOL N/A 10/13/2018   Procedure: COLONOSCOPY WITH PROPOFOL;  Surgeon: Lucilla Lame, MD;  Location: [facility] ENDOSCOPY;  Service: Endoscopy;  Laterality: N/A;   MELANOMA EXCISION Left 12/2012   Family History  Problem Relation Age of Onset   Hypertension Sister    Hypertension Brother    Heart attack Mother    Social History   Socioeconomic History   Marital status: Married    Spouse name: [name]    Number of children: 2   Years of education: Not on file   Highest education level: 10th grade  Occupational History   Occupation: driver     Comment: road crew for [employer]   Tobacco  Use   Smoking status: Former    Packs/day: 0.50    Years: 10.00    Total pack years: 5.00    Types: Cigarettes    Start date: 11/25/1953    Quit date: 01/22/1964    Years since quitting: 58.8   Smokeless tobacco: Never  Vaping Use   Vaping Use: Never used  Substance and Sexual Activity   Alcohol use: No    Alcohol/week: 0.0 standard drinks of alcohol   Drug use: No   Sexual activity: Not Currently    Birth control/protection: None  Other Topics Concern   Not on file  Social History Narrative   Married, independent. Retired    Building services engineer once a week    He has 3 grand-children and one great-grandchild    Social Determinants of Adult nurse Strain: Low Risk  (10/25/2021)   Overall Financial Resource Strain (CARDIA)    Difficulty of Paying Living Expenses: Not hard at all  Food Insecurity: No Food Insecurity (10/25/2021)   Hunger Vital Sign    Worried About Running Out of Food in the Last Year: Never true    Ran Out of Food in the Last Year: Never true  Transportation Needs: No Transportation Needs (10/25/2021)   PRAPARE - Hydrologist (Medical): No    Lack of Transportation (Non-Medical): No  Physical Activity: Insufficiently Active (10/25/2021)   Exercise Vital Sign    Days of Exercise per Week: 3 days    Minutes of Exercise per Session: 30 min  Stress: No Stress Concern Present (10/25/2021)   [place]    Feeling of Stress : Not at all  Social Connections: [place] (10/25/2021)   Social Connection and Isolation Panel [NHANES]    Frequency of Communication with Friends and Family: More than three times a week    Frequency of Social Gatherings with Friends and Family: Three times a week    Attends Religious Services: More than 4 times per year    Active Member of Clubs or Organizations: Yes    Attends Archivist Meetings: More than 4 times per year    Marital Status: Married    Tobacco Counseling Counseling given: Not Answered   Clinical Intake:                 Diabetic?No         Activities of Daily Living    08/01/2022    9:37 AM 01/28/2022    9:51 AM  In your present state of health, do you have any difficulty performing the following activities:  Hearing? 0 0  Vision? 0 0  Difficulty concentrating or making decisions? 0 0  Walking or climbing stairs? 0 0  Dressing or bathing? 0 0  Doing errands, shopping? 0 0    Patient Care Team: Steele Sizer, MD as PCP - General (Family Medicine) Dasher, Rayvon Char, MD as Consulting Physician (Dermatology) Vladimir Crofts, MD as Consulting Physician (Neurology)  Indicate any recent Medical Services you may have received from other than [facility] providers in the past year (date may be approximate).     Assessment:   This is a routine wellness examination for [place].  Hearing/Vision screen No results found.  Dietary issues and exercise activities discussed:     Goals Addressed   None    Depression Screen    08/01/2022    9:37 AM 01/28/2022  9:51 AM 10/25/2021    8:40 AM 07/31/2021    8:20 AM 01/26/2021    9:51 AM 10/24/2020    8:52 AM 07/28/2020   10:08 AM  PHQ 2/9 Scores  PHQ - 2 Score 0 0 0 0 0 0 0  PHQ- 9 Score 0 0         Fall Risk    08/01/2022    9:37 AM 01/28/2022    9:51 AM 10/25/2021    8:42 AM 07/31/2021    8:20 AM 01/26/2021    9:51 AM  Fall Risk   Falls in the past year? 0 0 0 0 0  Number falls in past yr: 0 0 0 0 0  Injury with Fall? 0 0 0 0 0  Risk for fall due to : No Fall Risks No Fall Risks No Fall Risks No Fall Risks   Follow up Falls prevention discussed Falls prevention discussed Falls prevention discussed Falls prevention discussed     FALL RISK PREVENTION PERTAINING TO THE HOME:  Any stairs in or around the home? Yes  If so, are there any without handrails? No  Home free of loose throw rugs in walkways, pet beds, electrical cords, etc? Yes  Adequate lighting in your home to reduce risk of falls? Yes   ASSISTIVE DEVICES UTILIZED TO PREVENT FALLS:  Life alert? No  Use of a cane, walker or w/c? No  Grab bars in the bathroom? No  Shower chair or bench in shower? No  Elevated toilet seat or a handicapped toilet? Yes   TIMED UP AND GO:  Was the test performed? No .  Length of time to ambulate 10 feet: n/a sec.     Cognitive Function:        10/24/2020    8:55 AM 10/15/2019   10:15 AM 09/22/2018   10:48 AM 09/19/2017    9:58 AM  6CIT Screen  What Year? 0 points 0 points 0 points 0 points  What month? 0 points 0 points 0 points   What time? 0 points 0 points 0  points 0 points  Count back from 20 0 points 0 points 0 points 0 points  Months in reverse 0 points 0 points 0 points 0 points  Repeat phrase 0 points 0 points 2 points 4 points  Total Score 0 points 0 points 2 points     Immunizations Immunization History  Administered Date(s) Administered   COVID-19, mRNA, vaccine(Comirnaty)12 years and older 09/13/2022   Fluad Quad(high Dose 65+) 07/23/2019, 07/28/2020, 07/31/2021, 08/01/2022   Influenza, High Dose Seasonal PF 08/27/2016, 09/08/2017, 09/22/2018   PFIZER Comirnaty(Gray Top)Covid-19 Tri-Sucrose Vaccine 03/22/2021   PFIZER(Purple Top)SARS-COV-2 Vaccination 12/14/2019, 01/08/2020, 09/13/2020   Pfizer Covid-19 Vaccine Bivalent Booster 4yr & up 09/20/2021   Pneumococcal Conjugate-13 01/21/2018   Pneumococcal Polysaccharide-23 08/27/2016   Zoster, Live 10/24/2016    TDAP status: Due, Education has been provided regarding the importance of this vaccine. Advised may receive this vaccine at local pharmacy or Health Dept. Aware to provide a copy of the vaccination record if obtained from local pharmacy or Health Dept. Verbalized acceptance and understanding.  Flu Vaccine status: Up to date  Pneumococcal vaccine status: Up to date  Covid-19 vaccine status: Completed vaccines  Qualifies for Shingles Vaccine? No   Zostavax completed No   Shingrix Completed?: No.    Education has been provided regarding the importance of this vaccine. Patient has been advised to call insurance company to determine out of pocket expense if  they have not yet received this vaccine. Advised may also receive vaccine at local pharmacy or Health Dept. Verbalized acceptance and understanding.  Screening Tests Health Maintenance  Topic Date Due   DTaP/Tdap/Td (1 - Tdap) Never done   Zoster Vaccines- Shingrix (1 of 2) Never done   Medicare Annual Wellness (AWV)  10/25/2022   COLONOSCOPY (Pts 45-15yr Insurance coverage will need to be confirmed)  10/14/2023    Pneumonia Vaccine 81 Years old  Completed   INFLUENZA VACCINE  Completed   COVID-19 Vaccine  Completed   Hepatitis C Screening  Completed   HPV VACCINES  Aged Out    Health Maintenance  Health Maintenance Due  Topic Date Due   DTaP/Tdap/Td (1 - Tdap) Never done   Zoster Vaccines- Shingrix (1 of 2) Never done   Medicare Annual Wellness (AWV)  10/25/2022    Colorectal cancer screening: Type of screening: Colonoscopy. Completed 10/13/2018. Repeat every 5 years  Lung Cancer Screening: (Low Dose CT Chest recommended if Age 79-80years, 30 pack-year currently smoking OR have quit w/in 15years.) does not qualify.   Lung Cancer Screening Referral: N/A  Additional Screening:  Hepatitis C Screening: does not qualify; Completed 07/28/2020  Vision Screening: Recommended annual ophthalmology exams for early detection of glaucoma and other disorders of the eye. Is the patient up to date with their annual eye exam?  Yes  Who is the provider or what is the name of the office in which the patient attends annual eye exams? Dr. WEllin MayhewIf pt is not established with a provider, would they like to be referred to a provider to establish care? No .   Dental Screening: Recommended annual dental exams for proper oral hygiene  Community Resource Referral / Chronic Care Management: CRR required this visit?  No   CCM required this visit?  No      Plan:     I have personally reviewed and noted the following in the patient's chart:   Medical and social history Use of alcohol, tobacco or illicit drugs  Current medications and supplements including opioid prescriptions. Patient is not currently taking opioid prescriptions. Functional ability and status Nutritional status Physical activity Advanced directives List of other physicians Hospitalizations, surgeries, and ER visits in previous 12 months Vitals Screenings to include cognitive, depression, and falls Referrals and appointments  In  addition, I have reviewed and discussed with patient certain preventive protocols, quality metrics, and best practice recommendations. A written personalized care plan for preventive services as well as general preventive health recommendations were provided to patient.     SRoyal Hawthorn CMonroe  10/29/2022   Nurse Notes:  Mr. SRainford, Thank you for taking time to come for your Medicare Wellness Visit. I appreciate your ongoing commitment to your health goals. Please review the following plan we discussed and let me know if I can assist you in the future.   These are the goals we discussed:  Goals      Reduce portion size     Recommend to reduce portion size of meals to 3 small healthy meals per day and 2 healthy snacks per day        This is a list of the screening recommended for you and due dates:  Health Maintenance  Topic Date Due   DTaP/Tdap/Td vaccine (1 - Tdap) Never done   Zoster (Shingles) Vaccine (1 of 2) Never done   Colon Cancer Screening  10/14/2023   Medicare Annual Wellness Visit  10/30/2023   Pneumonia Vaccine  Completed   Flu Shot  Completed   COVID-19 Vaccine  Completed   Hepatitis C Screening: USPSTF Recommendation to screen - Ages 68-79 yo.  Completed   HPV Vaccine  Aged Out

## 2022-12-30 ENCOUNTER — Other Ambulatory Visit: Payer: Self-pay | Admitting: Family Medicine

## 2022-12-30 DIAGNOSIS — K219 Gastro-esophageal reflux disease without esophagitis: Secondary | ICD-10-CM

## 2022-12-31 ENCOUNTER — Other Ambulatory Visit: Payer: Self-pay | Admitting: Family Medicine

## 2022-12-31 DIAGNOSIS — E785 Hyperlipidemia, unspecified: Secondary | ICD-10-CM

## 2023-01-29 NOTE — Progress Notes (Unsigned)
Name: Zachary Meyer   MRN: [id number]    DOB: 11/13/1941   Date:01/30/2023       Progress Note  Subjective  Chief Complaint  Follow Up  HPI  HTN: He takes ARB, HCTZ and low dose Norvasc. He is on Propanolol 60 ER but was added by neurologist for essential tremors. BP today is at goals Denies chest pain or palpitation  He states no recent episodes of dizziness   LUTS : discussed PSA but he is not interested, he is now on Flomax, he states nocturia is down to once per night occasionally twice but sometimes does not get up at all  . Good urine flow    Dyslipidemia: no muscles pains, doing well on Rosuvastatin LDL at 72    CKI stage III: based on low GFR for years, last level was 42 and we will recheck labs today   Reminded him to  avoid nsaid's.  He only takes Tylenol for pain prn.    Hyperglycemia: no polyphagia, polydipsia or polyuria but he has urinary frequency, nocturia also. Last A1C was down from 6.2 % to 5.8 %   Tremors: chronic, seen by neurologist, aggravated when he uses weed eater, he states symptoms are getting worse again, also triggered by eating ( left handed ) symptoms are stable     Senile purpura: stable. He takes aspirin. Reassurance given   Aorta Ectasy: he is on statin therapy and baby aspirin, discussed repeating imaging and we will refer him to vascular surgeon to have it done at there office   AR: he has intermittent symptoms of nasal congestion and rhinorrhea He needs a refill today   Vitamin D deficiency: continue otc supplementation   GERD: doing well on medication, no heartburn or indigestion   Patient Active Problem List   Diagnosis Date Noted   Ectatic abdominal aorta ([place]) 08/01/2022   Personal history of colonic polyps    Benign neoplasm of ascending colon    Benign neoplasm of transverse colon    Polyp of sigmoid colon    Senile purpura ([place]) 07/21/2018   Chronic kidney disease, stage III (moderate) ([place]) 07/21/2018   Vitamin D deficiency  07/21/2018   Hyperglycemia 01/23/2016   Hypertension 07/25/2015   GERD (gastroesophageal reflux disease) 07/25/2015   Hyperlipidemia 07/25/2015   Perennial allergic rhinitis 07/25/2015   Benign essential tremor 07/25/2015    Past Surgical History:  Procedure Laterality Date   COLONOSCOPY WITH PROPOFOL     COLONOSCOPY WITH PROPOFOL N/A 10/13/2018   Procedure: COLONOSCOPY WITH PROPOFOL;  Surgeon: Lucilla Lame, MD;  Location: [facility] ENDOSCOPY;  Service: Endoscopy;  Laterality: N/A;   MELANOMA EXCISION Left 12/2012    Family History  Problem Relation Age of Onset   Hypertension Sister    Hypertension Brother    Heart attack Mother     Social History   Tobacco Use   Smoking status: Former    Packs/day: 0.50    Years: 10.00    Total pack years: 5.00    Types: Cigarettes    Start date: 11/25/1953    Quit date: 01/22/1964    Years since quitting: 59.0   Smokeless tobacco: Never  Substance Use Topics   Alcohol use: No    Alcohol/week: 0.0 standard drinks of alcohol     Current Outpatient Medications:    amLODipine (NORVASC) 5 MG tablet, Take 1 tablet (5 mg total) by mouth daily., Disp: 90 tablet, Rfl: 1   aspirin EC 81 MG tablet, Take 1  tablet (81 mg total) by mouth daily., Disp: 30 tablet, Rfl: 0   Cholecalciferol (VITAMIN D3) 50 MCG (2000 UT) TABS, Take 1 tablet by mouth daily., Disp: 30 tablet, Rfl: 0   fluticasone (FLONASE) 50 MCG/ACT nasal spray, SPRAY 2 SPRAYS INTO EACH NOSTRIL EVERY DAY, Disp: 48 mL, Rfl: 2   omeprazole (PRILOSEC) 20 MG capsule, TAKE 1 CAPSULE BY MOUTH EVERY DAY, Disp: 90 capsule, Rfl: 0   propranolol ER (INDERAL LA) 60 MG 24 hr capsule, Take 1 capsule (60 mg total) by mouth daily., Disp: 90 capsule, Rfl: 1   rosuvastatin (CRESTOR) 40 MG tablet, TAKE 1 TABLET BY MOUTH EVERY DAY, Disp: 90 tablet, Rfl: 1   tamsulosin (FLOMAX) 0.4 MG CAPS capsule, Take 1 capsule (0.4 mg total) by mouth daily., Disp: 90 capsule, Rfl: 1   valsartan-hydrochlorothiazide  (DIOVAN-HCT) 320-12.5 MG tablet, Take 1 tablet by mouth daily. In place of losartan hctz, Disp: 90 tablet, Rfl: 1  No Known Allergies  I personally reviewed active problem list, medication list, allergies, family history, social history, health maintenance with the patient/caregiver today.   ROS  Constitutional: Negative for fever , positive for weight change.  Respiratory: Negative for cough and shortness of breath.   Cardiovascular: Negative for chest pain or palpitations.  Gastrointestinal: Negative for abdominal pain, no bowel changes.  Musculoskeletal: Negative for gait problem or joint swelling.  Skin: Negative for rash.  Neurological: Negative for dizziness or headache.  No other specific complaints in a complete review of systems (except as listed in HPI above).   Objective  Vitals:   01/30/23 0851  BP: 136/82  Pulse: 90  Resp: 16  SpO2: 96%  Weight: 209 lb (94.8 kg)  Height: 6' (1.829 m)    Body mass index is 28.35 kg/m.  Physical Exam  Constitutional: Patient appears well-developed and well-nourished. No distress.  HEENT: head atraumatic, normocephalic, pupils equal and reactive to light, neck supple Cardiovascular: Normal rate, regular rhythm and normal heart sounds.  No murmur heard. No BLE edema. Pulmonary/Chest: Effort normal and breath sounds normal. No respiratory distress. Abdominal: Soft.  There is no tenderness. Psychiatric: Patient has a normal mood and affect. behavior is normal. Judgment and thought content normal.   PHQ2/9:    01/30/2023    8:51 AM 10/29/2022    8:44 AM 10/29/2022    8:38 AM 08/01/2022    9:37 AM 01/28/2022    9:51 AM  Depression screen PHQ 2/9  Decreased Interest 0 0 0 0 0  Down, Depressed, Hopeless 0 0 0 0 0  PHQ - 2 Score 0 0 0 0 0  Altered sleeping 0 0  0 0  Tired, decreased energy 0 0  0 0  Change in appetite 0 0  0 0  Feeling bad or failure about yourself  0 0  0 0  Trouble concentrating 0 0  0 0  Moving slowly or  fidgety/restless 0 0  0 0  Suicidal thoughts 0 0  0 0  PHQ-9 Score 0 0  0 0    phq 9 is negative   Fall Risk:    01/30/2023    8:50 AM 10/29/2022    8:42 AM 08/01/2022    9:37 AM 01/28/2022    9:51 AM 10/25/2021    8:42 AM  Fall Risk   Falls in the past year? 0 0 0 0 0  Number falls in past yr: 0  0 0 0  Injury with Fall? 0  0 0  0  Risk for fall due to : No Fall Risks No Fall Risks No Fall Risks No Fall Risks No Fall Risks  Follow up Falls prevention discussed Falls prevention discussed;Education provided Falls prevention discussed Falls prevention discussed Falls prevention discussed      Functional Status Survey: Is the patient deaf or have difficulty hearing?: No Does the patient have difficulty seeing, even when wearing glasses/contacts?: No Does the patient have difficulty concentrating, remembering, or making decisions?: No Does the patient have difficulty walking or climbing stairs?: No Does the patient have difficulty dressing or bathing?: No Does the patient have difficulty doing errands alone such as visiting a doctor's office or shopping?: No    Assessment & Plan  1. Ectatic abdominal aorta ([place])  - Ambulatory referral to Vascular Surgery  2. Senile purpura ([place])  Reassurance given   3. Benign hypertension with chronic kidney disease, stage III (HCC)  - valsartan-hydrochlorothiazide (DIOVAN-HCT) 320-12.5 MG tablet; Take 1 tablet by mouth daily. In place of losartan hctz  Dispense: 90 tablet; Refill: 1  4. Dyslipidemia (high LDL; low HDL)   5. Stage 3a chronic kidney disease (HCC)  - valsartan-hydrochlorothiazide (DIOVAN-HCT) 320-12.5 MG tablet; Take 1 tablet by mouth daily. In place of losartan hctz  Dispense: 90 tablet; Refill: 1 - COMPLETE METABOLIC PANEL WITH GFR - VITAMIN D 25 Hydroxy (Vit-D Deficiency, Fractures) - Parathyroid hormone, intact (no Ca)  6. GERD without esophagitis  - omeprazole (PRILOSEC) 20 MG capsule; Take 1 capsule (20 mg total)  by mouth daily.  Dispense: 90 capsule; Refill: 1  7. Chronic rhinitis  - fluticasone (FLONASE) 50 MCG/ACT nasal spray; SPRAY 2 SPRAYS INTO EACH NOSTRIL EVERY DAY  Dispense: 48 mL; Refill: 1  8. Lower urinary tract symptoms (LUTS)  - tamsulosin (FLOMAX) 0.4 MG CAPS capsule; Take 1 capsule (0.4 mg total) by mouth daily.  Dispense: 90 capsule; Refill: 1  9. Vitamin D deficiency   10. Benign essential tremor  - propranolol ER (INDERAL LA) 60 MG 24 hr capsule; Take 1 capsule (60 mg total) by mouth daily.  Dispense: 90 capsule; Refill: 1  11. Essential hypertension  - amLODipine (NORVASC) 5 MG tablet; Take 1 tablet (5 mg total) by mouth daily.  Dispense: 90 tablet; Refill: 1 - valsartan-hydrochlorothiazide (DIOVAN-HCT) 320-12.5 MG tablet; Take 1 tablet by mouth daily. In place of losartan hctz  Dispense: 90 tablet; Refill: 1

## 2023-01-30 ENCOUNTER — Encounter: Payer: Self-pay | Admitting: Family Medicine

## 2023-01-30 ENCOUNTER — Ambulatory Visit: Payer: Medicare PPO | Admitting: Family Medicine

## 2023-01-30 VITALS — BP 136/82 | HR 90 | Resp 16 | Ht 72.0 in | Wt 209.0 lb

## 2023-01-30 DIAGNOSIS — I129 Hypertensive chronic kidney disease with stage 1 through stage 4 chronic kidney disease, or unspecified chronic kidney disease: Secondary | ICD-10-CM | POA: Diagnosis not present

## 2023-01-30 DIAGNOSIS — N183 Chronic kidney disease, stage 3 unspecified: Secondary | ICD-10-CM | POA: Diagnosis not present

## 2023-01-30 DIAGNOSIS — I10 Essential (primary) hypertension: Secondary | ICD-10-CM

## 2023-01-30 DIAGNOSIS — D692 Other nonthrombocytopenic purpura: Secondary | ICD-10-CM | POA: Diagnosis not present

## 2023-01-30 DIAGNOSIS — I77811 Abdominal aortic ectasia: Secondary | ICD-10-CM | POA: Diagnosis not present

## 2023-01-30 DIAGNOSIS — E559 Vitamin D deficiency, unspecified: Secondary | ICD-10-CM | POA: Diagnosis not present

## 2023-01-30 DIAGNOSIS — K219 Gastro-esophageal reflux disease without esophagitis: Secondary | ICD-10-CM

## 2023-01-30 DIAGNOSIS — G250 Essential tremor: Secondary | ICD-10-CM

## 2023-01-30 DIAGNOSIS — E785 Hyperlipidemia, unspecified: Secondary | ICD-10-CM | POA: Diagnosis not present

## 2023-01-30 DIAGNOSIS — N1831 Chronic kidney disease, stage 3a: Secondary | ICD-10-CM

## 2023-01-30 DIAGNOSIS — J310 Chronic rhinitis: Secondary | ICD-10-CM

## 2023-01-30 DIAGNOSIS — R399 Unspecified symptoms and signs involving the genitourinary system: Secondary | ICD-10-CM

## 2023-01-30 MED ORDER — OMEPRAZOLE 20 MG PO CPDR: 20.0000 mg | DELAYED_RELEASE_CAPSULE | Freq: Every day | ORAL | 1 refills | Status: DC

## 2023-01-30 MED ORDER — AMLODIPINE BESYLATE 5 MG PO TABS: 5.0000 mg | ORAL_TABLET | Freq: Every day | ORAL | 1 refills | Status: DC

## 2023-01-30 MED ORDER — VALSARTAN-HYDROCHLOROTHIAZIDE 320-12.5 MG PO TABS: 1.0000 | ORAL_TABLET | Freq: Every day | ORAL | 1 refills | Status: DC

## 2023-01-30 MED ORDER — PROPRANOLOL HCL ER 60 MG PO CP24: 60.0000 mg | ORAL_CAPSULE | Freq: Every day | ORAL | 1 refills | Status: DC

## 2023-01-30 MED ORDER — FLUTICASONE PROPIONATE 50 MCG/ACT NA SUSP: NASAL | 1 refills | Status: DC

## 2023-01-30 MED ORDER — TAMSULOSIN HCL 0.4 MG PO CAPS: 0.4000 mg | ORAL_CAPSULE | Freq: Every day | ORAL | 1 refills | Status: DC

## 2023-01-30 NOTE — Patient Instructions (Signed)
Please go to local pharmacy and get shingrix , tdap and RSV

## 2023-01-31 LAB — VITAMIN D 25 HYDROXY (VIT D DEFICIENCY, FRACTURES): Vit D, 25-Hydroxy: 43 ng/mL (ref 30–100)

## 2023-01-31 LAB — COMPLETE METABOLIC PANEL WITH GFR
AG Ratio: 1.7 (calc) (ref 1.0–2.5)
ALT: 18 U/L (ref 9–46)
AST: 16 U/L (ref 10–35)
Albumin: 4.5 g/dL (ref 3.6–5.1)
Alkaline phosphatase (APISO): 71 U/L (ref 35–144)
BUN/Creatinine Ratio: 13 (calc) (ref 6–22)
BUN: 22 mg/dL (ref 7–25)
CO2: 28 mmol/L (ref 20–32)
Calcium: 9.6 mg/dL (ref 8.6–10.3)
Chloride: 105 mmol/L (ref 98–110)
Creat: 1.72 mg/dL — ABNORMAL HIGH (ref 0.70–1.28)
Globulin: 2.6 g/dL (calc) (ref 1.9–3.7)
Glucose, Bld: 108 mg/dL — ABNORMAL HIGH (ref 65–99)
Potassium: 5 mmol/L (ref 3.5–5.3)
Sodium: 143 mmol/L (ref 135–146)
Total Bilirubin: 0.5 mg/dL (ref 0.2–1.2)
Total Protein: 7.1 g/dL (ref 6.1–8.1)
eGFR: 40 mL/min/{1.73_m2} — ABNORMAL LOW (ref >=60)

## 2023-01-31 LAB — MICROALBUMIN / CREATININE URINE RATIO
Creatinine, Urine: 135 mg/dL (ref 20–320)
Microalb Creat Ratio: 7 mcg/mg creat (ref <30)
Microalb, Ur: 0.9 mg/dL

## 2023-01-31 LAB — PARATHYROID HORMONE, INTACT (NO CA): PTH: 63 pg/mL (ref 16–77)

## 2023-02-25 ENCOUNTER — Other Ambulatory Visit (INDEPENDENT_AMBULATORY_CARE_PROVIDER_SITE_OTHER): Payer: Self-pay | Admitting: Nurse Practitioner

## 2023-02-25 DIAGNOSIS — I77811 Abdominal aortic ectasia: Secondary | ICD-10-CM

## 2023-03-07 ENCOUNTER — Ambulatory Visit (INDEPENDENT_AMBULATORY_CARE_PROVIDER_SITE_OTHER): Payer: Medicare PPO | Admitting: Nurse Practitioner

## 2023-03-07 ENCOUNTER — Ambulatory Visit (INDEPENDENT_AMBULATORY_CARE_PROVIDER_SITE_OTHER): Payer: Medicare PPO

## 2023-03-07 ENCOUNTER — Encounter (INDEPENDENT_AMBULATORY_CARE_PROVIDER_SITE_OTHER): Payer: Self-pay | Admitting: Nurse Practitioner

## 2023-03-07 VITALS — BP 131/76 | HR 72 | Resp 18 | Ht 72.0 in | Wt 209.0 lb

## 2023-03-07 DIAGNOSIS — I10 Essential (primary) hypertension: Secondary | ICD-10-CM | POA: Diagnosis not present

## 2023-03-07 DIAGNOSIS — I77811 Abdominal aortic ectasia: Secondary | ICD-10-CM

## 2023-03-07 DIAGNOSIS — E785 Hyperlipidemia, unspecified: Secondary | ICD-10-CM | POA: Diagnosis not present

## 2023-03-30 ENCOUNTER — Encounter (INDEPENDENT_AMBULATORY_CARE_PROVIDER_SITE_OTHER): Payer: Self-pay | Admitting: Nurse Practitioner

## 2023-03-30 NOTE — Progress Notes (Signed)
Subjective:    Patient ID: Zachary Meyer, male    DOB: 21-Mar-1941, 82 y.o.   MRN: [id number] Chief Complaint  Patient presents with   New Patient (Initial Visit)    NP AAA +consult -    The patient presents to the office for evaluation of an abdominal aortic aneurysm. The aneurysm was found incidentally by screening.  It was found to be an ectatic aorta of 2.7 cm in 2019. patient denies abdominal pain or unusual back pain, no other abdominal complaints.  No history of an abrupt onset of a painful toe associated with blue discoloration.     No family history of AAA.   Patient denies amaurosis fugax or TIA symptoms. There is no history of claudication or rest pain symptoms of the lower extremities.  The patient denies angina or shortness of breath.  Today noninvasive studies show abdominal aortic measurement of 2.7 cm    Review of Systems  Gastrointestinal:  Negative for abdominal pain.  All other systems reviewed and are negative.      Objective:   Physical Exam Vitals reviewed.  HENT:     Head: Normocephalic.  Cardiovascular:     Rate and Rhythm: Normal rate.     Pulses: Normal pulses.  Pulmonary:     Effort: Pulmonary effort is normal.  Skin:    General: Skin is warm and dry.  Neurological:     Mental Status: He is alert and oriented to person, place, and time.  Psychiatric:        Mood and Affect: Mood normal.        Behavior: Behavior normal.        Thought Content: Thought content normal.        Judgment: Judgment normal.     BP 131/76 (BP Location: Left Arm)   Pulse 72   Resp 18   Ht 6' (1.829 m)   Wt 209 lb (94.8 kg)   BMI 28.35 kg/m   Past Medical History:  Diagnosis Date   Cancer (HCC)    Skin Cancer ([place])   Chronic kidney disease, stage III (moderate) (HCC)    GERD (gastroesophageal reflux disease)    Hyperlipidemia    Hypertension     Social History   Socioeconomic History   Marital status: Married    Spouse name: Steward Drone     Number of children: 2   Years of education: Not on file   Highest education level: 10th grade  Occupational History   Occupation: driver     Comment: road crew for [employer]   Tobacco Use   Smoking status: Former    Packs/day: 0.50    Years: 10.00    Additional pack years: 0.00    Total pack years: 5.00    Types: Cigarettes    Start date: 11/25/1953    Quit date: 01/22/1964    Years since quitting: 59.2   Smokeless tobacco: Never  Vaping Use   Vaping Use: Never used  Substance and Sexual Activity   Alcohol use: No    Alcohol/week: 0.0 standard drinks of alcohol   Drug use: No   Sexual activity: Not Currently    Birth control/protection: None  Other Topics Concern   Not on file  Social History Narrative   Married, independent. Retired    Administrator, arts once a week    He has 3 grand-children and one great-grandchild    Social Determinants of Corporate investment banker Strain: Low Risk  (  10/29/2022)   Overall Financial Resource Strain (CARDIA)    Difficulty of Paying Living Expenses: Not hard at all  Food Insecurity: No Food Insecurity (10/29/2022)   Hunger Vital Sign    Worried About Running Out of Food in the Last Year: Never true    Ran Out of Food in the Last Year: Never true  Transportation Needs: No Transportation Needs (10/29/2022)   PRAPARE - Administrator, Civil Service (Medical): No    Lack of Transportation (Non-Medical): No  Physical Activity: Insufficiently Active (10/29/2022)   Exercise Vital Sign    Days of Exercise per Week: 4 days    Minutes of Exercise per Session: 30 min  Stress: No Stress Concern Present (10/29/2022)   [employer] of Occupational Health - Occupational Stress Questionnaire    Feeling of Stress : Not at all  Social Connections: Socially Integrated (10/29/2022)   Social Connection and Isolation Panel [NHANES]    Frequency of Communication with Friends and Family: Twice a week    Frequency of Social  Gatherings with Friends and Family: Twice a week    Attends Religious Services: More than 4 times per year    Active Member of [employer] or Organizations: No    Attends Engineer, structural: More than 4 times per year    Marital Status: Married  Catering manager Violence: Not At Risk (10/29/2022)   Humiliation, Afraid, Rape, and Kick questionnaire    Fear of Current or Ex-Partner: No    Emotionally Abused: No    Physically Abused: No    Sexually Abused: No    Past Surgical History:  Procedure Laterality Date   COLONOSCOPY WITH PROPOFOL     COLONOSCOPY WITH PROPOFOL N/A 10/13/2018   Procedure: COLONOSCOPY WITH PROPOFOL;  Surgeon: Midge Minium, MD;  Location: [facility] ENDOSCOPY;  Service: Endoscopy;  Laterality: N/A;   MELANOMA EXCISION Left 12/2012    Family History  Problem Relation Age of Onset   Hypertension Sister    Hypertension Brother    Heart attack Mother     No Known Allergies     Latest Ref Rng & Units 08/01/2022   10:34 AM 07/31/2021    9:19 AM 07/28/2020   10:35 AM  CBC  WBC 3.8 - 10.8 Thousand/uL 8.0  8.0  7.9   Hemoglobin 13.2 - 17.1 g/dL 81.1  91.4  78.2   Hematocrit 38.5 - 50.0 % 40.3  44.6  44.2   Platelets 140 - 400 Thousand/uL 228  247  268       CMP     Component Value Date/Time   NA 143 01/30/2023 0948   NA 141 04/23/2016 0947   K 5.0 01/30/2023 0948   CL 105 01/30/2023 0948   CO2 28 01/30/2023 0948   GLUCOSE 108 (H) 01/30/2023 0948   BUN 22 01/30/2023 0948   BUN 17 04/23/2016 0947   CREATININE 1.72 (H) 01/30/2023 0948   CALCIUM 9.6 01/30/2023 0948   PROT 7.1 01/30/2023 0948   PROT 7.2 04/23/2016 0947   ALBUMIN 4.4 04/18/2017 0859   ALBUMIN 4.5 04/23/2016 0947   AST 16 01/30/2023 0948   ALT 18 01/30/2023 0948   ALKPHOS 90 04/18/2017 0859   BILITOT 0.5 01/30/2023 0948   BILITOT 0.5 04/23/2016 0947   GFRNONAA 47 (L) 01/26/2021 1056   GFRAA 54 (L) 01/26/2021 1056     No results found.     Assessment & Plan:   1. Ectatic abdominal  aorta (HCC) Recommend: No  surgery or intervention is indicated at this time.  The patient has an asymptomatic abdominal aortic aneurysm that is less than 4 cm in maximal diameter.    I have reviewed the natural history of abdominal aortic aneurysm and the small risk of rupture for aneurysm less than 5 cm in size.  However, as these small aneurysms tend to enlarge over time, continued surveillance with ultrasound or CT scan is mandatory.   I have also discussed optimizing medical management with hypertension and lipid control and the importance of abstinence from tobacco.  The patient is also encouraged to exercise a minimum of 30 minutes 4 times a week.   Should the patient develop new onset abdominal or back pain or signs of peripheral embolization they are instructed to seek medical attention immediately and to alert the physician providing care that they have an aneurysm.   The patient voices their understanding.  The patient will return in 24 months with an aortic duplex. - VAS [place] AAA DUPLEX  2. Primary hypertension Continue antihypertensive medications as already ordered, these medications have been reviewed and there are no changes at this time.  3. Hyperlipidemia, unspecified hyperlipidemia type Continue statin as ordered and reviewed, no changes at this time   Current Outpatient Medications on File Prior to Visit  Medication Sig Dispense Refill   amLODipine (NORVASC) 5 MG tablet Take 1 tablet (5 mg total) by mouth daily. 90 tablet 1   aspirin EC 81 MG tablet Take 1 tablet (81 mg total) by mouth daily. 30 tablet 0   Cholecalciferol (VITAMIN D3) 50 MCG (2000 UT) TABS Take 1 tablet by mouth daily. 30 tablet 0   fluticasone (FLONASE) 50 MCG/ACT nasal spray SPRAY 2 SPRAYS INTO EACH NOSTRIL EVERY DAY 48 mL 1   omeprazole (PRILOSEC) 20 MG capsule Take 1 capsule (20 mg total) by mouth daily. 90 capsule 1   propranolol ER (INDERAL LA) 60 MG 24 hr capsule Take 1 capsule (60 mg total)  by mouth daily. 90 capsule 1   rosuvastatin (CRESTOR) 40 MG tablet TAKE 1 TABLET BY MOUTH EVERY DAY 90 tablet 1   tamsulosin (FLOMAX) 0.4 MG CAPS capsule Take 1 capsule (0.4 mg total) by mouth daily. 90 capsule 1   valsartan-hydrochlorothiazide (DIOVAN-HCT) 320-12.5 MG tablet Take 1 tablet by mouth daily. In place of losartan hctz 90 tablet 1   No current facility-administered medications on file prior to visit.    There are no Patient Instructions on file for this visit. No follow-ups on file.   Georgiana Spinner, NP

## 2023-06-22 ENCOUNTER — Other Ambulatory Visit: Payer: Self-pay | Admitting: Family Medicine

## 2023-06-22 DIAGNOSIS — E785 Hyperlipidemia, unspecified: Secondary | ICD-10-CM

## 2023-06-23 ENCOUNTER — Other Ambulatory Visit: Payer: Self-pay

## 2023-06-23 DIAGNOSIS — E785 Hyperlipidemia, unspecified: Secondary | ICD-10-CM

## 2023-07-01 DIAGNOSIS — H1045 Other chronic allergic conjunctivitis: Secondary | ICD-10-CM | POA: Diagnosis not present

## 2023-07-01 DIAGNOSIS — H2513 Age-related nuclear cataract, bilateral: Secondary | ICD-10-CM | POA: Diagnosis not present

## 2023-07-25 ENCOUNTER — Other Ambulatory Visit: Payer: Self-pay | Admitting: Family Medicine

## 2023-07-25 DIAGNOSIS — J310 Chronic rhinitis: Secondary | ICD-10-CM

## 2023-08-01 NOTE — Progress Notes (Unsigned)
Name: Zachary Meyer   MRN: [id number]    DOB: 02-Dec-1940   Date:08/04/2023       Progress Note  Subjective  Chief Complaint  Follow Up  HPI  HTN: He takes ARB, HCTZ , low dose norvasc and Propanolol ER 60 mg (added by neurologist for essential tremors) . BP today is at goals Denies chest pain, palpitation , he states occasionally when working out in his yard has some orthostatic changes   LUTS : discussed PSA but he is not interested, he is now on Flomax, he states nocturia is down to once per night occasionally twice but sometimes does not get up at all . He states flow is still good    Dyslipidemia: no muscles pains, doing well on Rosuvastatin LDL at 72 , we will recheck levels today    CKI stage III: based on low GFR for years, last level was 40 , pth normal, also vitamin D level, he does not take NSAID's and knows to only take Tylenol if needed    Hyperglycemia: no polyphagia, polydipsia or polyuria but he has urinary frequency, nocturia also. Last A1C was down from 6.2 % to 5.8 % , we will recheck it today   Tremors: chronic, seen by neurologist, aggravated when he uses weed eater, he states symptoms are getting worse again, also triggered by eating ( left handed ) symptoms are stable     Senile purpura: stable. He takes aspirin and stable symptoms   Aorta Ectasy: he is on statin therapy and baby aspirin, he was seen by vascular surgeon 02/2023 and will have a repeat doppler in 2026   AR: he has intermittent symptoms of nasal congestion and rhinorrhea . He still has medication at home    Vitamin D deficiency: continue otc supplementation   GERD: doing well on medication, no heartburn or indigestion He takes omeprazole 20 mg daily   Patient Active Problem List   Diagnosis Date Noted   Ectatic abdominal aorta (HCC) 08/01/2022   Personal history of colonic polyps    Benign neoplasm of ascending colon    Benign neoplasm of transverse colon    Polyp of sigmoid colon    Senile  purpura (HCC) 07/21/2018   Chronic kidney disease, stage III (moderate) (HCC) 07/21/2018   Vitamin D deficiency 07/21/2018   Hyperglycemia 01/23/2016   Hypertension 07/25/2015   GERD (gastroesophageal reflux disease) 07/25/2015   Hyperlipidemia 07/25/2015   Perennial allergic rhinitis 07/25/2015   Benign essential tremor 07/25/2015    Past Surgical History:  Procedure Laterality Date   COLONOSCOPY WITH PROPOFOL     COLONOSCOPY WITH PROPOFOL N/A 10/13/2018   Procedure: COLONOSCOPY WITH PROPOFOL;  Surgeon: Midge Minium, MD;  Location: [facility] ENDOSCOPY;  Service: Endoscopy;  Laterality: N/A;   MELANOMA EXCISION Left 12/2012    Family History  Problem Relation Age of Onset   Hypertension Sister    Hypertension Brother    Heart attack Mother     Social History   Tobacco Use   Smoking status: Former    Current packs/day: 0.00    Average packs/day: 0.5 packs/day for 10.2 years (5.1 ttl pk-yrs)    Types: Cigarettes    Start date: 11/25/1953    Quit date: 01/22/1964    Years since quitting: 59.5   Smokeless tobacco: Never  Substance Use Topics   Alcohol use: No    Alcohol/week: 0.0 standard drinks of alcohol     Current Outpatient Medications:    amLODipine (NORVASC) 5 MG  tablet, Take 1 tablet (5 mg total) by mouth daily., Disp: 90 tablet, Rfl: 1   aspirin EC 81 MG tablet, Take 1 tablet (81 mg total) by mouth daily., Disp: 30 tablet, Rfl: 0   Cholecalciferol (VITAMIN D3) 50 MCG (2000 UT) TABS, Take 1 tablet by mouth daily., Disp: 30 tablet, Rfl: 0   fluticasone (FLONASE) 50 MCG/ACT nasal spray, SPRAY 2 SPRAYS INTO EACH NOSTRIL EVERY DAY, Disp: 48 mL, Rfl: 1   omeprazole (PRILOSEC) 20 MG capsule, Take 1 capsule (20 mg total) by mouth daily., Disp: 90 capsule, Rfl: 1   propranolol ER (INDERAL LA) 60 MG 24 hr capsule, Take 1 capsule (60 mg total) by mouth daily., Disp: 90 capsule, Rfl: 1   rosuvastatin (CRESTOR) 40 MG tablet, TAKE 1 TABLET BY MOUTH EVERY DAY, Disp: 90 tablet, Rfl: 0    tamsulosin (FLOMAX) 0.4 MG CAPS capsule, Take 1 capsule (0.4 mg total) by mouth daily., Disp: 90 capsule, Rfl: 1   valsartan-hydrochlorothiazide (DIOVAN-HCT) 320-12.5 MG tablet, Take 1 tablet by mouth daily. In place of losartan hctz, Disp: 90 tablet, Rfl: 1  No Known Allergies  I personally reviewed active problem list, medication list, allergies, family history, social history, health maintenance with the patient/caregiver today.   ROS  Constitutional: Negative for fever or weight change.  Respiratory: Negative for cough and shortness of breath.   Cardiovascular: Negative for chest pain or palpitations.  Gastrointestinal: Negative for abdominal pain, no bowel changes.  Musculoskeletal: Negative for gait problem or joint swelling.  Skin: Negative for rash.  Neurological: Negative for dizziness or headache.  No other specific complaints in a complete review of systems (except as listed in HPI above).   Objective  Vitals:   08/04/23 0955  BP: 136/78  Pulse: 74  Resp: 16  SpO2: 98%  Weight: 209 lb (94.8 kg)  Height: 6' (1.829 m)    Body mass index is 28.35 kg/m.  Physical Exam  Constitutional: Patient appears well-developed and well-nourished.  No distress.  HEENT: head atraumatic, normocephalic, pupils equal and reactive to light, neck supple Cardiovascular: Normal rate, regular rhythm and normal heart sounds.  No murmur heard. No BLE edema. Pulmonary/Chest: Effort normal and breath sounds normal. No respiratory distress. Abdominal: Soft.  There is no tenderness. Psychiatric: Patient has a normal mood and affect. behavior is normal. Judgment and thought content normal.    PHQ2/9:    08/04/2023    9:54 AM 01/30/2023    8:51 AM 10/29/2022    8:44 AM 10/29/2022    8:38 AM 08/01/2022    9:37 AM  Depression screen PHQ 2/9  Decreased Interest 0 0 0 0 0  Down, Depressed, Hopeless 0 0 0 0 0  PHQ - 2 Score 0 0 0 0 0  Altered sleeping 0 0 0  0  Tired, decreased energy 0 0 0  0   Change in appetite 0 0 0  0  Feeling bad or failure about yourself  0 0 0  0  Trouble concentrating 0 0 0  0  Moving slowly or fidgety/restless 0 0 0  0  Suicidal thoughts 0 0 0  0  PHQ-9 Score 0 0 0  0    phq 9 is negative   Fall Risk:    08/04/2023    9:54 AM 01/30/2023    8:50 AM 10/29/2022    8:42 AM 08/01/2022    9:37 AM 01/28/2022    9:51 AM  Fall Risk   Falls in the  past year? 0 0 0 0 0  Number falls in past yr: 0 0  0 0  Injury with Fall? 0 0  0 0  Risk for fall due to : No Fall Risks No Fall Risks No Fall Risks No Fall Risks No Fall Risks  Follow up Falls prevention discussed Falls prevention discussed Falls prevention discussed;Education provided Falls prevention discussed Falls prevention discussed      Functional Status Survey: Is the patient deaf or have difficulty hearing?: No Does the patient have difficulty seeing, even when wearing glasses/contacts?: No Does the patient have difficulty concentrating, remembering, or making decisions?: No Does the patient have difficulty walking or climbing stairs?: No Does the patient have difficulty dressing or bathing?: No Does the patient have difficulty doing errands alone such as visiting a doctor's office or shopping?: No    Assessment & Plan  1. Senile purpura (HCC)  Reassurance given   2. Stage 3a chronic kidney disease (HCC)  - valsartan-hydrochlorothiazide (DIOVAN-HCT) 320-12.5 MG tablet; Take 1 tablet by mouth daily.  Dispense: 90 tablet; Refill: 1 - COMPLETE METABOLIC PANEL WITH GFR - CBC with Differential/Platelet  3. Ectatic abdominal aorta (HCC)  - Lipid panel  4. Benign hypertension with chronic kidney disease, stage III (HCC)  - valsartan-hydrochlorothiazide (DIOVAN-HCT) 320-12.5 MG tablet; Take 1 tablet by mouth daily.  Dispense: 90 tablet; Refill: 1  5. Benign essential tremor  - propranolol ER (INDERAL LA) 60 MG 24 hr capsule; Take 1 capsule (60 mg total) by mouth daily.  Dispense: 90 capsule;  Refill: 1  6. Lower urinary tract symptoms (LUTS)  - tamsulosin (FLOMAX) 0.4 MG CAPS capsule; Take 1 capsule (0.4 mg total) by mouth daily.  Dispense: 90 capsule; Refill: 1  7. Essential hypertension  - amLODipine (NORVASC) 5 MG tablet; Take 1 tablet (5 mg total) by mouth daily.  Dispense: 90 tablet; Refill: 1 - valsartan-hydrochlorothiazide (DIOVAN-HCT) 320-12.5 MG tablet; Take 1 tablet by mouth daily.  Dispense: 90 tablet; Refill: 1  8. GERD without esophagitis  - omeprazole (PRILOSEC) 20 MG capsule; Take 1 capsule (20 mg total) by mouth daily.  Dispense: 90 capsule; Refill: 1  9. Hyperglycemia  - Hemoglobin A1c  10. Chronic rhinitis  Stable on prn nasal spray  11. Needs flu shot  - Flu vaccine HIGH DOSE PF (Fluzone High dose)  12. Need for vaccination with 20-polyvalent pneumococcal conjugate vaccine  - Pneumococcal conjugate vaccine 20-valent (Prevnar 20)

## 2023-08-04 ENCOUNTER — Ambulatory Visit: Payer: Medicare PPO | Admitting: Family Medicine

## 2023-08-04 ENCOUNTER — Encounter: Payer: Self-pay | Admitting: Family Medicine

## 2023-08-04 VITALS — BP 136/78 | HR 74 | Resp 16 | Ht 72.0 in | Wt 209.0 lb

## 2023-08-04 DIAGNOSIS — J310 Chronic rhinitis: Secondary | ICD-10-CM

## 2023-08-04 DIAGNOSIS — G250 Essential tremor: Secondary | ICD-10-CM | POA: Diagnosis not present

## 2023-08-04 DIAGNOSIS — I77811 Abdominal aortic ectasia: Secondary | ICD-10-CM | POA: Diagnosis not present

## 2023-08-04 DIAGNOSIS — K219 Gastro-esophageal reflux disease without esophagitis: Secondary | ICD-10-CM

## 2023-08-04 DIAGNOSIS — D692 Other nonthrombocytopenic purpura: Secondary | ICD-10-CM | POA: Diagnosis not present

## 2023-08-04 DIAGNOSIS — R739 Hyperglycemia, unspecified: Secondary | ICD-10-CM | POA: Diagnosis not present

## 2023-08-04 DIAGNOSIS — I129 Hypertensive chronic kidney disease with stage 1 through stage 4 chronic kidney disease, or unspecified chronic kidney disease: Secondary | ICD-10-CM | POA: Diagnosis not present

## 2023-08-04 DIAGNOSIS — N183 Chronic kidney disease, stage 3 unspecified: Secondary | ICD-10-CM | POA: Diagnosis not present

## 2023-08-04 DIAGNOSIS — R399 Unspecified symptoms and signs involving the genitourinary system: Secondary | ICD-10-CM | POA: Diagnosis not present

## 2023-08-04 DIAGNOSIS — Z23 Encounter for immunization: Secondary | ICD-10-CM | POA: Diagnosis not present

## 2023-08-04 DIAGNOSIS — I10 Essential (primary) hypertension: Secondary | ICD-10-CM | POA: Diagnosis not present

## 2023-08-04 DIAGNOSIS — Z860100 Personal history of colon polyps, unspecified: Secondary | ICD-10-CM

## 2023-08-04 DIAGNOSIS — N1831 Chronic kidney disease, stage 3a: Secondary | ICD-10-CM

## 2023-08-04 MED ORDER — PROPRANOLOL HCL ER 60 MG PO CP24
60.0000 mg | ORAL_CAPSULE | Freq: Every day | ORAL | 1 refills | Status: DC
Start: 2023-08-04 — End: 2024-02-02

## 2023-08-04 MED ORDER — OMEPRAZOLE 20 MG PO CPDR
20.0000 mg | DELAYED_RELEASE_CAPSULE | Freq: Every day | ORAL | 1 refills | Status: DC
Start: 2023-08-04 — End: 2024-02-02

## 2023-08-04 MED ORDER — TAMSULOSIN HCL 0.4 MG PO CAPS
0.4000 mg | ORAL_CAPSULE | Freq: Every day | ORAL | 1 refills | Status: DC
Start: 2023-08-04 — End: 2024-02-02

## 2023-08-04 MED ORDER — VALSARTAN-HYDROCHLOROTHIAZIDE 320-12.5 MG PO TABS: 1.0000 | ORAL_TABLET | Freq: Every day | ORAL | 1 refills | Status: DC

## 2023-08-04 MED ORDER — ROSUVASTATIN CALCIUM 40 MG PO TABS: 40.0000 mg | ORAL_TABLET | Freq: Every day | ORAL | 0 refills | Status: DC

## 2023-08-04 MED ORDER — AMLODIPINE BESYLATE 5 MG PO TABS
5.0000 mg | ORAL_TABLET | Freq: Every day | ORAL | 1 refills | Status: DC
Start: 2023-08-04 — End: 2024-02-02

## 2023-08-05 LAB — COMPLETE METABOLIC PANEL WITH GFR
AG Ratio: 2 (calc) (ref 1.0–2.5)
ALT: 16 U/L (ref 9–46)
AST: 15 U/L (ref 10–35)
Albumin: 4.7 g/dL (ref 3.6–5.1)
Alkaline phosphatase (APISO): 68 U/L (ref 35–144)
BUN/Creatinine Ratio: 12 (calc) (ref 6–22)
BUN: 20 mg/dL (ref 7–25)
CO2: 29 mmol/L (ref 20–32)
Calcium: 10.1 mg/dL (ref 8.6–10.3)
Chloride: 103 mmol/L (ref 98–110)
Creat: 1.72 mg/dL — ABNORMAL HIGH (ref 0.70–1.28)
Globulin: 2.4 g/dL (ref 1.9–3.7)
Glucose, Bld: 103 mg/dL — ABNORMAL HIGH (ref 65–99)
Potassium: 5.1 mmol/L (ref 3.5–5.3)
Sodium: 141 mmol/L (ref 135–146)
Total Bilirubin: 0.5 mg/dL (ref 0.2–1.2)
Total Protein: 7.1 g/dL (ref 6.1–8.1)
eGFR: 40 mL/min/{1.73_m2} — ABNORMAL LOW (ref >=60)

## 2023-08-05 LAB — CBC WITH DIFFERENTIAL/PLATELET
Absolute Monocytes: 587 {cells}/uL (ref 200–950)
Basophils Absolute: 51 {cells}/uL (ref 0–200)
Basophils Relative: 0.6 %
Eosinophils Absolute: 332 {cells}/uL (ref 15–500)
Eosinophils Relative: 3.9 %
HCT: 41.7 % (ref 38.5–50.0)
Hemoglobin: 14 g/dL (ref 13.2–17.1)
Lymphs Abs: 1233 {cells}/uL (ref 850–3900)
MCH: 31.4 pg (ref 27.0–33.0)
MCHC: 33.6 g/dL (ref 32.0–36.0)
MCV: 93.5 fL (ref 80.0–100.0)
MPV: 10.9 fL (ref 7.5–12.5)
Monocytes Relative: 6.9 %
Neutro Abs: 6299 {cells}/uL (ref 1500–7800)
Neutrophils Relative %: 74.1 %
Platelets: 241 10*3/uL (ref 140–400)
RBC: 4.46 10*6/uL (ref 4.20–5.80)
RDW: 11.6 % (ref 11.0–15.0)
Total Lymphocyte: 14.5 %
WBC: 8.5 10*3/uL (ref 3.8–10.8)

## 2023-08-05 LAB — LIPID PANEL
Cholesterol: 138 mg/dL (ref <200)
HDL: 40 mg/dL (ref >=40)
LDL Cholesterol (Calc): 77 mg/dL
Non-HDL Cholesterol (Calc): 98 mg/dL (ref <130)
Total CHOL/HDL Ratio: 3.5 (calc) (ref <5.0)
Triglycerides: 127 mg/dL (ref <150)

## 2023-08-05 LAB — HEMOGLOBIN A1C
Hgb A1c MFr Bld: 6.2 %{Hb} — ABNORMAL HIGH (ref <5.7)
Mean Plasma Glucose: 131 mg/dL
eAG (mmol/L): 7.3 mmol/L

## 2023-08-13 ENCOUNTER — Telehealth: Payer: Self-pay | Admitting: *Deleted

## 2023-08-13 ENCOUNTER — Other Ambulatory Visit: Payer: Self-pay | Admitting: *Deleted

## 2023-08-13 DIAGNOSIS — Z86010 Personal history of colonic polyps: Secondary | ICD-10-CM

## 2023-08-13 MED ORDER — PEG 3350-KCL-NABCB-NACL-NASULF 236 G PO SOLR: 4000.0000 mL | Freq: Once | ORAL | 0 refills | Status: AC

## 2023-08-13 NOTE — Telephone Encounter (Signed)
Gastroenterology Pre-Procedure Review  Request Date: 10/30/2023 Requesting Physician: Dr. Servando Snare  PATIENT REVIEW QUESTIONS: The patient responded to the following health history questions as indicated:    1. Are you having any GI issues? no 2. Do you have a personal history of Polyps? yes (10/13/2018) 3. Do you have a family history of Colon Cancer or Polyps? no 4. Diabetes Mellitus? no 5. Joint replacements in the past 12 months?no 6. Major health problems in the past 3 months?no 7. Any artificial heart valves, MVP, or defibrillator? Ectatic abdominal aorta    MEDICATIONS & ALLERGIES:    Patient reports the following regarding taking any anticoagulation/antiplatelet therapy:   Plavix, Coumadin, Eliquis, Xarelto, Lovenox, Pradaxa, Brilinta, or Effient? no Aspirin? yes (81 mg)  Patient confirms/reports the following medications:  Current Outpatient Medications  Medication Sig Dispense Refill   amLODipine (NORVASC) 5 MG tablet Take 1 tablet (5 mg total) by mouth daily. 90 tablet 1   aspirin EC 81 MG tablet Take 1 tablet (81 mg total) by mouth daily. 30 tablet 0   Cholecalciferol (VITAMIN D3) 50 MCG (2000 UT) TABS Take 1 tablet by mouth daily. 30 tablet 0   fluticasone (FLONASE) 50 MCG/ACT nasal spray SPRAY 2 SPRAYS INTO EACH NOSTRIL EVERY DAY 48 mL 1   omeprazole (PRILOSEC) 20 MG capsule Take 1 capsule (20 mg total) by mouth daily. 90 capsule 1   propranolol ER (INDERAL LA) 60 MG 24 hr capsule Take 1 capsule (60 mg total) by mouth daily. 90 capsule 1   rosuvastatin (CRESTOR) 40 MG tablet Take 1 tablet (40 mg total) by mouth daily. 90 tablet 0   tamsulosin (FLOMAX) 0.4 MG CAPS capsule Take 1 capsule (0.4 mg total) by mouth daily. 90 capsule 1   valsartan-hydrochlorothiazide (DIOVAN-HCT) 320-12.5 MG tablet Take 1 tablet by mouth daily. 90 tablet 1   No current facility-administered medications for this visit.    Patient confirms/reports the following allergies:  No Known Allergies  No  orders of the defined types were placed in this encounter.   AUTHORIZATION INFORMATION Primary Insurance: 1D#: Group #:  Secondary Insurance: 1D#: Group #:  SCHEDULE INFORMATION: Date: 10/30/2023 Time: Location: [facility]

## 2023-08-19 DIAGNOSIS — Z85828 Personal history of other malignant neoplasm of skin: Secondary | ICD-10-CM | POA: Diagnosis not present

## 2023-08-19 DIAGNOSIS — L570 Actinic keratosis: Secondary | ICD-10-CM | POA: Diagnosis not present

## 2023-08-19 DIAGNOSIS — D225 Melanocytic nevi of trunk: Secondary | ICD-10-CM | POA: Diagnosis not present

## 2023-08-19 DIAGNOSIS — L821 Other seborrheic keratosis: Secondary | ICD-10-CM | POA: Diagnosis not present

## 2023-08-19 DIAGNOSIS — Z08 Encounter for follow-up examination after completed treatment for malignant neoplasm: Secondary | ICD-10-CM | POA: Diagnosis not present

## 2023-08-19 DIAGNOSIS — D485 Neoplasm of uncertain behavior of skin: Secondary | ICD-10-CM | POA: Diagnosis not present

## 2023-08-19 DIAGNOSIS — C44519 Basal cell carcinoma of skin of other part of trunk: Secondary | ICD-10-CM | POA: Diagnosis not present

## 2023-09-11 ENCOUNTER — Telehealth: Payer: Self-pay | Admitting: *Deleted

## 2023-09-11 ENCOUNTER — Encounter: Payer: Self-pay | Admitting: *Deleted

## 2023-09-11 NOTE — Telephone Encounter (Signed)
Per [facility] on 08/18/2023  Patient is cleared to have procedure.  Noted: Ectatic abdominal aorta stable

## 2023-09-16 DIAGNOSIS — C44519 Basal cell carcinoma of skin of other part of trunk: Secondary | ICD-10-CM | POA: Diagnosis not present

## 2023-09-16 DIAGNOSIS — D235 Other benign neoplasm of skin of trunk: Secondary | ICD-10-CM | POA: Diagnosis not present

## 2023-10-29 ENCOUNTER — Encounter: Payer: Self-pay | Admitting: Gastroenterology

## 2023-10-30 ENCOUNTER — Ambulatory Visit
Admission: RE | Admit: 2023-10-30 | Discharge: 2023-10-30 | Disposition: A | Discharge status: Home / Self Care | Payer: Medicare PPO | Source: Ambulatory Visit | Attending: Gastroenterology | Admitting: Gastroenterology

## 2023-10-30 ENCOUNTER — Encounter
Admission: RE | Disposition: A | Discharge status: Home / Self Care | Payer: Self-pay | Source: Ambulatory Visit | Attending: Gastroenterology

## 2023-10-30 ENCOUNTER — Ambulatory Visit: Payer: Medicare PPO | Admitting: General Practice

## 2023-10-30 ENCOUNTER — Encounter: Payer: Self-pay | Admitting: Gastroenterology

## 2023-10-30 DIAGNOSIS — Z1211 Encounter for screening for malignant neoplasm of colon: Secondary | ICD-10-CM | POA: Diagnosis not present

## 2023-10-30 DIAGNOSIS — Z87891 Personal history of nicotine dependence: Secondary | ICD-10-CM | POA: Diagnosis not present

## 2023-10-30 DIAGNOSIS — K635 Polyp of colon: Secondary | ICD-10-CM

## 2023-10-30 DIAGNOSIS — K640 First degree hemorrhoids: Secondary | ICD-10-CM | POA: Diagnosis not present

## 2023-10-30 DIAGNOSIS — K219 Gastro-esophageal reflux disease without esophagitis: Secondary | ICD-10-CM | POA: Insufficient documentation

## 2023-10-30 DIAGNOSIS — D122 Benign neoplasm of ascending colon: Secondary | ICD-10-CM

## 2023-10-30 DIAGNOSIS — Z860100 Personal history of colon polyps, unspecified: Secondary | ICD-10-CM

## 2023-10-30 DIAGNOSIS — I129 Hypertensive chronic kidney disease with stage 1 through stage 4 chronic kidney disease, or unspecified chronic kidney disease: Secondary | ICD-10-CM | POA: Diagnosis not present

## 2023-10-30 DIAGNOSIS — N183 Chronic kidney disease, stage 3 unspecified: Secondary | ICD-10-CM | POA: Diagnosis not present

## 2023-10-30 HISTORY — PX: COLONOSCOPY WITH PROPOFOL: SHX5780

## 2023-10-30 HISTORY — PX: POLYPECTOMY: SHX5525

## 2023-10-30 SURGERY — COLONOSCOPY WITH PROPOFOL
Anesthesia: General

## 2023-10-30 MED ORDER — SODIUM CHLORIDE 0.9 % IV SOLN
INTRAVENOUS | Status: DC
Start: 2023-10-30 — End: 2023-10-30

## 2023-10-30 MED ORDER — PHENYLEPHRINE 80 MCG/ML (10ML) SYRINGE FOR IV PUSH (FOR BLOOD PRESSURE SUPPORT)
PREFILLED_SYRINGE | INTRAVENOUS | Status: DC | PRN
  Administered 2023-10-30: 80 ug via INTRAVENOUS
  Administered 2023-10-30 (×2): 160 ug via INTRAVENOUS

## 2023-10-30 MED ORDER — PROPOFOL 10 MG/ML IV BOLUS
INTRAVENOUS | Status: DC | PRN
  Administered 2023-10-30: 70 mg via INTRAVENOUS

## 2023-10-30 MED ORDER — PROPOFOL 500 MG/50ML IV EMUL
INTRAVENOUS | Status: DC | PRN
  Administered 2023-10-30: 120 ug/kg/min via INTRAVENOUS

## 2023-10-30 MED ORDER — EPHEDRINE SULFATE (PRESSORS) 50 MG/ML IJ SOLN: INTRAMUSCULAR | Status: DC | PRN

## 2023-10-30 MED ORDER — EPHEDRINE SULFATE-NACL 50-0.9 MG/10ML-% IV SOSY
PREFILLED_SYRINGE | INTRAVENOUS | Status: DC | PRN
  Administered 2023-10-30: 5 mg via INTRAVENOUS

## 2023-10-30 NOTE — Op Note (Signed)
[facility]astroenter[facility] Patient Name: Zachary Meyer Procedure Date: 10/30/2023 9:51 AM MRN: [id number] Account #: [id number] Date of Birth: [birth year] Admit Type: Outpatient Age: 82 Room: [facility] ENDO ROOM 4 Gender: Male Note Status: Finalized Instrument Name: Prentice Docker [id number] Procedure:             Colonoscopy Indications:           High risk colon cancer surveillance: Personal history                         of colonic polyps Providers:             Midge Minium MD, MD Medicines:             Propofol per Anesthesia Complications:         No immediate complications. Procedure:             Pre-Anesthesia Assessment:                        - Prior to the procedure, a History and Physical was                         performed, and patient medications and allergies were                         reviewed. The patient's tolerance of previous                         anesthesia was also reviewed. The risks and benefits                         of the procedure and the sedation options and risks                         were discussed with the patient. All questions were                         answered, and informed consent was obtained. Prior                         Anticoagulants: The patient has taken no anticoagulant                         or antiplatelet agents. ASA Grade Assessment: II - A                         patient with mild systemic disease. After reviewing                         the risks and benefits, the patient was deemed in                         satisfactory condition to undergo the procedure.                        After obtaining informed consent, the colonoscope was                         passed under direct vision. Throughout  the procedure,                         the patient's blood pressure, pulse, and oxygen                         saturations were monitored continuously. The                         Colonoscope was introduced through the  anus and                         advanced to the the cecum, identified by appendiceal                         orifice and ileocecal valve. The colonoscopy was                         performed without difficulty. The patient tolerated                         the procedure well. The quality of the bowel                         preparation was excellent. Findings:      The perianal and digital rectal examinations were normal.      A 4 mm polyp was found in the ascending colon. The polyp was sessile.       The polyp was removed with a cold snare. Resection and retrieval were       complete.      Non-bleeding internal hemorrhoids were found during retroflexion. The       hemorrhoids were Grade I (internal hemorrhoids that do not prolapse). Impression:            - One 4 mm polyp in the ascending colon, removed with                         a cold snare. Resected and retrieved.                        - Non-bleeding internal hemorrhoids. Recommendation:        - Discharge patient to home.                        - Resume previous diet.                        - Continue present medications.                        - Await pathology results.                        - Repeat colonoscopy is not recommended for                         surveillance. Procedure Code(s):     --- Professional ---                        [phone], Colonoscopy, flexible; with removal of  tumor(s), polyp(s), or other lesion(s) by snare                         technique Diagnosis Code(s):     --- Professional ---                        Z86.010, Personal history of colonic polyps                        D12.2, Benign neoplasm of ascending colon CPT copyright 2022 American Medical Association. All rights reserved. The codes documented in this report are preliminary and upon coder review may  be revised to meet current compliance requirements. Midge Minium MD, MD 10/30/2023 10:18:33 AM This report has been  signed electronically. Number of Addenda: 0 Note Initiated On: 10/30/2023 9:51 AM Scope Withdrawal Time: 0 hours 7 minutes 26 seconds  Total Procedure Duration: 0 hours 10 minutes 50 seconds  Estimated Blood Loss:  Estimated blood loss: none.      [facility]

## 2023-10-30 NOTE — H&P (Signed)
Midge Minium, MD [facility] [address], [address] [place], [place] [address] Phone:[phone] Fax : [phone]  Primary Care Physician:  Alba Cory, MD Primary Gastroenterologist:  Dr. Servando Snare  Pre-Procedure History & Physical: HPI:  Zachary Meyer is a 82 y.o. male is here for an colonoscopy.   Past Medical History:  Diagnosis Date   Cancer (HCC)    Skin Cancer (Basal Cell)   Chronic kidney disease, stage III (moderate) (HCC)    GERD (gastroesophageal reflux disease)    Hyperlipidemia    Hypertension     Past Surgical History:  Procedure Laterality Date   COLONOSCOPY WITH PROPOFOL     COLONOSCOPY WITH PROPOFOL N/A 10/13/2018   Procedure: COLONOSCOPY WITH PROPOFOL;  Surgeon: Midge Minium, MD;  Location: [facility] ENDOSCOPY;  Service: Endoscopy;  Laterality: N/A;   MELANOMA EXCISION Left 12/2012    Prior to Admission medications   Medication Sig Start Date End Date Taking? Authorizing Provider  amLODipine (NORVASC) 5 MG tablet Take 1 tablet (5 mg total) by mouth daily. 08/04/23  Yes Alba Cory, MD  aspirin EC 81 MG tablet Take 1 tablet (81 mg total) by mouth daily. 01/21/18  Yes Sowles, Danna Hefty, MD  Cholecalciferol (VITAMIN D3) 50 MCG (2000 UT) TABS Take 1 tablet by mouth daily. 01/21/19  Yes Sowles, Danna Hefty, MD  fluticasone [facility]) 50 MCG/ACT nasal spray SPRAY 2 SPRAYS INTO EACH NOSTRIL EVERY DAY 07/25/23  Yes Sowles, Danna Hefty, MD  omeprazole (PRILOSEC) 20 MG capsule Take 1 capsule (20 mg total) by mouth daily. 08/04/23  Yes Sowles, Danna Hefty, MD  propranolol ER (INDERAL LA) 60 MG 24 hr capsule Take 1 capsule (60 mg total) by mouth daily. 08/04/23  Yes Sowles, Danna Hefty, MD  rosuvastatin (CRESTOR) 40 MG tablet Take 1 tablet (40 mg total) by mouth daily. 08/04/23  Yes Sowles, Danna Hefty, MD  tamsulosin (FLOMAX) 0.4 MG CAPS capsule Take 1 capsule (0.4 mg total) by mouth daily. 08/04/23  Yes Sowles, Danna Hefty, MD  valsartan-hydrochlorothiazide (DIOVAN-HCT) 320-12.5 MG tablet Take 1 tablet by mouth  daily. 08/04/23  Yes Alba Cory, MD    Allergies as of 08/13/2023   (No Known Allergies)    Family History  Problem Relation Age of Onset   Hypertension Sister    Hypertension Brother    Heart attack Mother     Social History   Socioeconomic History   Marital status: Married    Spouse name: Steward Drone    Number of children: 2   Years of education: Not on file   Highest education level: 10th grade  Occupational History   Occupation: driver     Comment: road crew for [employer]   Tobacco Use   Smoking status: Former    Current packs/day: 0.00    Average packs/day: 0.5 packs/day for 10.2 years (5.1 ttl pk-yrs)    Types: Cigarettes    Start date: 11/25/1953    Quit date: 01/22/1964    Years since quitting: 59.8   Smokeless tobacco: Never  Vaping Use   Vaping status: Never Used  Substance and Sexual Activity   Alcohol use: No    Alcohol/week: 0.0 standard drinks of alcohol   Drug use: No   Sexual activity: Not Currently    Birth control/protection: None  Other Topics Concern   Not on file  Social History Narrative   Married, independent. Retired    Administrator, arts once a week    He has 3 grand-children and one great-grandchild    Social Determinants of Health  Financial Resource Strain: Low Risk  (10/29/2022)   Overall Financial Resource Strain (CARDIA)    Difficulty of Paying Living Expenses: Not hard at all  Food Insecurity: No Food Insecurity (10/29/2022)   Hunger Vital Sign    Worried About Running Out of Food in the Last Year: Never true    Ran Out of Food in the Last Year: Never true  Transportation Needs: No Transportation Needs (10/29/2022)   PRAPARE - Administrator, Civil Service (Medical): No    Lack of Transportation (Non-Medical): No  Physical Activity: Insufficiently Active (10/29/2022)   Exercise Vital Sign    Days of Exercise per Week: 4 days    Minutes of Exercise per Session: 30 min  Stress: No Stress Concern Present  (10/29/2022)   [employer] of Occupational Health - Occupational Stress Questionnaire    Feeling of Stress : Not at all  Social Connections: Socially Integrated (10/29/2022)   Social Connection and Isolation Panel [NHANES]    Frequency of Communication with Friends and Family: Twice a week    Frequency of Social Gatherings with Friends and Family: Twice a week    Attends Religious Services: More than 4 times per year    Active Member of [employer] or Organizations: No    Attends Engineer, structural: More than 4 times per year    Marital Status: Married  Catering manager Violence: Not At Risk (10/29/2022)   Humiliation, Afraid, Rape, and Kick questionnaire    Fear of Current or Ex-Partner: No    Emotionally Abused: No    Physically Abused: No    Sexually Abused: No    Review of Systems: See HPI, otherwise negative ROS  Physical Exam: BP (!) 143/77   Pulse 80   Temp (!) 96.8 F (36 C) (Axillary)   Resp 18   Ht 6' (1.829 m)   Wt 93.6 kg   SpO2 100%   BMI 28.00 kg/m  General:   Alert,  pleasant and cooperative in NAD Head:  Normocephalic and atraumatic. Neck:  Supple; no masses or thyromegaly. Lungs:  Clear throughout to auscultation.    Heart:  Regular rate and rhythm. Abdomen:  Soft, nontender and nondistended. Normal bowel sounds, without guarding, and without rebound.   Neurologic:  Alert and  oriented x4;  grossly normal neurologically.  Impression/Plan: LEMARR POCH is here for an colonoscopy to be performed for a history of adenomatous polyps on 2019   Risks, benefits, limitations, and alternatives regarding  colonoscopy have been reviewed with the patient.  Questions have been answered.  All parties agreeable.   Midge Minium, MD  10/30/2023, 9:40 AM

## 2023-10-30 NOTE — Transfer of Care (Signed)
Immediate Anesthesia Transfer of Care Note  Patient: Zachary Meyer  Procedure(s) Performed: COLONOSCOPY WITH PROPOFOL  Patient Location: PACU  Anesthesia Type:General  Level of Consciousness: awake  Airway & Oxygen Therapy: Patient Spontanous Breathing and Patient connected to nasal cannula oxygen  Post-op Assessment: Report given to RN and Post -op Vital signs reviewed and stable  Post vital signs: Reviewed and stable  Last Vitals:  Vitals Value Taken Time  BP 119/59 10/30/23 1019  Temp 36.2 C 10/30/23 1019  Pulse 82 10/30/23 1023  Resp 16 10/30/23 1023  SpO2 97 % 10/30/23 1023  Vitals shown include unfiled device data.  Last Pain:  Vitals:   10/30/23 1019  TempSrc: Temporal  PainSc: 0-No pain         Complications: No notable events documented.

## 2023-10-30 NOTE — Anesthesia Preprocedure Evaluation (Signed)
Anesthesia Evaluation  Patient identified by MRN, date of birth, ID band Patient awake    Reviewed: Allergy & Precautions, NPO status , Patient's Chart, lab work & pertinent test results  Airway Mallampati: III  TM Distance: >3 FB Neck ROM: full    Dental  (+) Chipped, Dental Advidsory Given   Pulmonary neg pulmonary ROS, former smoker   Pulmonary exam normal        Cardiovascular hypertension, negative cardio ROS Normal cardiovascular exam     Neuro/Psych negative neurological ROS  negative psych ROS   GI/Hepatic Neg liver ROS,GERD  Medicated,,  Endo/Other  negative endocrine ROS    Renal/GU Renal disease  negative genitourinary   Musculoskeletal   Abdominal   Peds  Hematology negative hematology ROS (+)   Anesthesia Other Findings Past Medical History: No date: Cancer (HCC)     Comment:  Skin Cancer (Basal Cell) No date: Chronic kidney disease, stage III (moderate) (HCC) No date: GERD (gastroesophageal reflux disease) No date: Hyperlipidemia No date: Hypertension  Past Surgical History: No date: COLONOSCOPY WITH PROPOFOL 10/13/2018: COLONOSCOPY WITH PROPOFOL; N/A     Comment:  Procedure: COLONOSCOPY WITH PROPOFOL;  Surgeon: Midge Minium, MD;  Location: [facility] ENDOSCOPY;  Service:               Endoscopy;  Laterality: N/A; 12/2012: MELANOMA EXCISION; Left  BMI    Body Mass Index: 28.00 kg/m      Reproductive/Obstetrics negative OB ROS                             Anesthesia Physical Anesthesia Plan  ASA: 2  Anesthesia Plan: General   Post-op Pain Management: Minimal or no pain anticipated   Induction: Intravenous  PONV Risk Score and Plan: 3 and Propofol infusion, TIVA and Ondansetron  Airway Management Planned: Nasal Cannula  Additional Equipment: None  Intra-op Plan:   Post-operative Plan:   Informed Consent: I have reviewed the patients History  and Physical, chart, labs and discussed the procedure including the risks, benefits and alternatives for the proposed anesthesia with the patient or authorized representative who has indicated his/her understanding and acceptance.     Dental advisory given  Plan Discussed with: CRNA and Surgeon  Anesthesia Plan Comments: (Discussed risks of anesthesia with patient, including possibility of difficulty with spontaneous ventilation under anesthesia necessitating airway intervention, PONV, and rare risks such as cardiac or respiratory or neurological events, and allergic reactions. Discussed the role of CRNA in patient's perioperative care. Patient understands.)       Anesthesia Quick Evaluation

## 2023-10-30 NOTE — Anesthesia Postprocedure Evaluation (Signed)
Anesthesia Post Note  Patient: Zachary Meyer  Procedure(s) Performed: COLONOSCOPY WITH PROPOFOL  Patient location during evaluation: Endoscopy Anesthesia Type: General Level of consciousness: awake and alert Pain management: pain level controlled Vital Signs Assessment: post-procedure vital signs reviewed and stable Respiratory status: spontaneous breathing, nonlabored ventilation, respiratory function stable and patient connected to nasal cannula oxygen Cardiovascular status: blood pressure returned to baseline and stable Postop Assessment: no apparent nausea or vomiting Anesthetic complications: no  No notable events documented.   Last Vitals:  Vitals:   10/30/23 1039 10/30/23 1040  BP: (!) 98/57 103/63  Pulse: 85 78  Resp: 17 15  Temp:    SpO2: 97% 98%    Last Pain:  Vitals:   10/30/23 1040  TempSrc:   PainSc: 0-No pain                 Stephanie Coup

## 2023-10-31 ENCOUNTER — Encounter: Payer: Self-pay | Admitting: Gastroenterology

## 2023-10-31 LAB — SURGICAL PATHOLOGY

## 2023-11-03 ENCOUNTER — Encounter: Payer: Self-pay | Admitting: Gastroenterology

## 2023-12-13 ENCOUNTER — Other Ambulatory Visit: Payer: Self-pay | Admitting: Family Medicine

## 2024-01-26 ENCOUNTER — Encounter: Payer: Self-pay | Admitting: Gastroenterology

## 2024-02-02 ENCOUNTER — Encounter: Payer: Self-pay | Admitting: Family Medicine

## 2024-02-02 ENCOUNTER — Ambulatory Visit: Payer: Medicare PPO | Admitting: Family Medicine

## 2024-02-02 VITALS — BP 126/78 | HR 69 | Resp 16 | Ht 73.0 in | Wt 212.7 lb

## 2024-02-02 DIAGNOSIS — E785 Hyperlipidemia, unspecified: Secondary | ICD-10-CM

## 2024-02-02 DIAGNOSIS — I129 Hypertensive chronic kidney disease with stage 1 through stage 4 chronic kidney disease, or unspecified chronic kidney disease: Secondary | ICD-10-CM | POA: Diagnosis not present

## 2024-02-02 DIAGNOSIS — I77811 Abdominal aortic ectasia: Secondary | ICD-10-CM | POA: Diagnosis not present

## 2024-02-02 DIAGNOSIS — G250 Essential tremor: Secondary | ICD-10-CM

## 2024-02-02 DIAGNOSIS — N1831 Chronic kidney disease, stage 3a: Secondary | ICD-10-CM

## 2024-02-02 DIAGNOSIS — K219 Gastro-esophageal reflux disease without esophagitis: Secondary | ICD-10-CM

## 2024-02-02 DIAGNOSIS — E559 Vitamin D deficiency, unspecified: Secondary | ICD-10-CM

## 2024-02-02 DIAGNOSIS — N183 Chronic kidney disease, stage 3 unspecified: Secondary | ICD-10-CM

## 2024-02-02 DIAGNOSIS — R399 Unspecified symptoms and signs involving the genitourinary system: Secondary | ICD-10-CM

## 2024-02-02 MED ORDER — OMEPRAZOLE 20 MG PO CPDR: 20.0000 mg | DELAYED_RELEASE_CAPSULE | Freq: Every day | ORAL | 1 refills | Status: DC

## 2024-02-02 MED ORDER — TAMSULOSIN HCL 0.4 MG PO CAPS: 0.4000 mg | ORAL_CAPSULE | Freq: Every day | ORAL | 1 refills | Status: DC

## 2024-02-02 MED ORDER — AMLODIPINE BESYLATE 5 MG PO TABS: 5.0000 mg | ORAL_TABLET | Freq: Every day | ORAL | 1 refills | Status: DC

## 2024-02-02 MED ORDER — VALSARTAN-HYDROCHLOROTHIAZIDE 160-12.5 MG PO TABS: 1.0000 | ORAL_TABLET | Freq: Every day | ORAL | 0 refills | Status: DC

## 2024-02-02 MED ORDER — PROPRANOLOL HCL ER 60 MG PO CP24: 60.0000 mg | ORAL_CAPSULE | Freq: Every day | ORAL | 1 refills | Status: DC

## 2024-02-02 MED ORDER — ROSUVASTATIN CALCIUM 40 MG PO TABS: 40.0000 mg | ORAL_TABLET | Freq: Every day | ORAL | 1 refills | Status: DC

## 2024-02-02 NOTE — Addendum Note (Signed)
 Addended by: Alba Cory F on: 02/02/2024 12:53 PM   Modules accepted: Level of Service

## 2024-02-02 NOTE — Progress Notes (Signed)
 Name: Zachary Meyer   MRN: [id number]    DOB: [birth year]   Date:02/02/2024       Progress Note  Subjective  Chief Complaint  Chief Complaint  Patient presents with   Medical Management of Chronic Issues   HPI   HTN: He takes ARB, HCTZ , low dose norvasc and Propanolol ER 60 mg (added by neurologist for essential tremors) . BP today today is well controlled but he gets dizzy when he stands up quickly or stoops over. He states at home around 125-130/70-80's  Denies chest pain, palpitation . Due to his age we will try going down on dose of valsartan hydrochlorothiazide 320/12.5 mg to 160/12.5 mg.   LUTS : discussed PSA but he is not interested, he is now on Flomax, he states nocturia is down to once per night occasionally twice but sometimes does not get up at all. He is doing well on current regiment    Dyslipidemia: no muscles pains, doing well on Rosuvastatin LDL went up from 72 to 77. HDL improved, states more active since watching his great- grandson twice a week and has to move around   CKI stage III: based on low GFR for years, last level was 40 and stable, pth normal, also vitamin D level, he does not take NSAID's and knows to only take Tylenol if needed. He takes ARB   Hyperglycemia: no polyphagia, polydipsia or polyuria but he has urinary frequency, nocturia also. Last A1C was down from 6.2 % to 5.8 % but last level up again to 6.2 %    Tremors: chronic, seen by neurologist, aggravated when he uses weed eater, he also gets worse in the cold months, also triggered by eating ( left handed ) symptoms are stable. On beta blocker     Aorta Ectasy: he is on statin therapy and baby aspirin, he was seen by vascular surgeon 02/2023 and stable in size. They will contact him when due for a repeat    AR: he has intermittent symptoms of nasal congestion and rhinorrhea . He uses nasal steroid prn    Vitamin D deficiency: continue otc supplementation . Unchanged    GERD: doing well on  medication, no heartburn or indigestion He takes omeprazole 20 mg daily . Continue medication  Patient Active Problem List   Diagnosis Date Noted   Polyp of colon 10/30/2023   Ectatic abdominal aorta (HCC) 08/01/2022   History of colonic polyps    Benign neoplasm of ascending colon    Benign neoplasm of transverse colon    Polyp of sigmoid colon    Senile purpura (HCC) 07/21/2018   Chronic kidney disease, stage III (moderate) (HCC) 07/21/2018   Vitamin D deficiency 07/21/2018   Hyperglycemia 01/23/2016   Hypertension 07/25/2015   GERD (gastroesophageal reflux disease) 07/25/2015   Hyperlipidemia 07/25/2015   Perennial allergic rhinitis 07/25/2015   Benign essential tremor 07/25/2015    Past Surgical History:  Procedure Laterality Date   COLONOSCOPY WITH PROPOFOL     COLONOSCOPY WITH PROPOFOL N/A 10/13/2018   Procedure: COLONOSCOPY WITH PROPOFOL;  Surgeon: Midge Minium, MD;  Location: [facility] ENDOSCOPY;  Service: Endoscopy;  Laterality: N/A;   COLONOSCOPY WITH PROPOFOL N/A 10/30/2023   Procedure: COLONOSCOPY WITH PROPOFOL;  Surgeon: Midge Minium, MD;  Location: [facility] ENDOSCOPY;  Service: Endoscopy;  Laterality: N/A;   MELANOMA EXCISION Left 12/2012   POLYPECTOMY  10/30/2023   Procedure: POLYPECTOMY;  Surgeon: Midge Minium, MD;  Location: [facility] ENDOSCOPY;  Service: Endoscopy;;    Family  History  Problem Relation Age of Onset   Hypertension Sister    Hypertension Brother    Heart attack Mother     Social History   Tobacco Use   Smoking status: Former    Current packs/day: 0.00    Average packs/day: 0.5 packs/day for 10.2 years (5.1 ttl pk-yrs)    Types: Cigarettes    Start date: 11/25/1953    Quit date: 01/22/1964    Years since quitting: 60.0   Smokeless tobacco: Never  Substance Use Topics   Alcohol use: No    Alcohol/week: 0.0 standard drinks of alcohol     Current Outpatient Medications:    amLODipine (NORVASC) 5 MG tablet, Take 1 tablet (5 mg total) by mouth daily.,  Disp: 90 tablet, Rfl: 1   aspirin EC 81 MG tablet, Take 1 tablet (81 mg total) by mouth daily., Disp: 30 tablet, Rfl: 0   Cholecalciferol (VITAMIN D3) 50 MCG (2000 UT) TABS, Take 1 tablet by mouth daily., Disp: 30 tablet, Rfl: 0   fluticasone (FLONASE) 50 MCG/ACT nasal spray, SPRAY 2 SPRAYS INTO EACH NOSTRIL EVERY DAY, Disp: 48 mL, Rfl: 1   omeprazole (PRILOSEC) 20 MG capsule, Take 1 capsule (20 mg total) by mouth daily., Disp: 90 capsule, Rfl: 1   propranolol ER (INDERAL LA) 60 MG 24 hr capsule, Take 1 capsule (60 mg total) by mouth daily., Disp: 90 capsule, Rfl: 1   rosuvastatin (CRESTOR) 40 MG tablet, TAKE 1 TABLET BY MOUTH EVERY DAY, Disp: 90 tablet, Rfl: 0   tamsulosin (FLOMAX) 0.4 MG CAPS capsule, Take 1 capsule (0.4 mg total) by mouth daily., Disp: 90 capsule, Rfl: 1   valsartan-hydrochlorothiazide (DIOVAN-HCT) 320-12.5 MG tablet, Take 1 tablet by mouth daily., Disp: 90 tablet, Rfl: 1  No Known Allergies  I personally reviewed active problem list, medication list with the patient/caregiver today.   ROS  Ten systems reviewed and is negative except as mentioned in HPI    Objective  Vitals:   02/02/24 0957  BP: 126/78  Pulse: 69  Resp: 16  SpO2: 97%  Weight: 212 lb 11.2 oz (96.5 kg)  Height: 6\' 1"  (1.854 m)    Body mass index is 28.06 kg/m.  Physical Exam  Constitutional: Patient appears well-developed and well-nourished.  No distress.  HEENT: head atraumatic, normocephalic, pupils equal and reactive to light, neck supple Cardiovascular: Normal rate, regular rhythm and normal heart sounds.  No murmur heard. No BLE edema. Pulmonary/Chest: Effort normal and breath sounds normal. No respiratory distress. Abdominal: Soft.  There is no tenderness. Psychiatric: Patient has a normal mood and affect. behavior is normal. Judgment and thought content normal.   Diabetic Foot Exam:     PHQ2/9:    02/02/2024    9:56 AM 08/04/2023    9:54 AM 01/30/2023    8:51 AM 10/29/2022     8:44 AM 10/29/2022    8:38 AM  Depression screen PHQ 2/9  Decreased Interest 0 0 0 0 0  Down, Depressed, Hopeless 0 0 0 0 0  PHQ - 2 Score 0 0 0 0 0  Altered sleeping 0 0 0 0   Tired, decreased energy 0 0 0 0   Change in appetite 0 0 0 0   Feeling bad or failure about yourself  0 0 0 0   Trouble concentrating 0 0 0 0   Moving slowly or fidgety/restless 0 0 0 0   Suicidal thoughts 0 0 0 0   PHQ-9 Score 0 0 0  0   Difficult doing work/chores Not difficult at all        phq 9 is negative  Fall Risk:    02/02/2024    9:56 AM 08/04/2023    9:54 AM 01/30/2023    8:50 AM 10/29/2022    8:42 AM 08/01/2022    9:37 AM  Fall Risk   Falls in the past year? 0 0 0 0 0  Number falls in past yr: 0 0 0  0  Injury with Fall? 0 0 0  0  Risk for fall due to : No Fall Risks No Fall Risks No Fall Risks No Fall Risks No Fall Risks  Follow up Falls prevention discussed;Education provided;Falls evaluation completed Falls prevention discussed Falls prevention discussed Falls prevention discussed;Education provided Falls prevention discussed     Assessment & Plan  1. Stage 3a chronic kidney disease (HCC) (Primary)  - valsartan-hydrochlorothiazide (DIOVAN-HCT) 160-12.5 MG tablet; Take 1 tablet by mouth daily. In place of 320/12.5 mg  Dispense: 30 tablet; Refill: 0  2. Benign hypertension with chronic kidney disease, stage III (HCC)  - amLODipine (NORVASC) 5 MG tablet; Take 1 tablet (5 mg total) by mouth daily.  Dispense: 90 tablet; Refill: 1 - valsartan-hydrochlorothiazide (DIOVAN-HCT) 160-12.5 MG tablet; Take 1 tablet by mouth daily. In place of 320/12.5 mg  Dispense: 30 tablet; Refill: 0  3. Ectatic abdominal aorta (HCC)  - rosuvastatin (CRESTOR) 40 MG tablet; Take 1 tablet (40 mg total) by mouth daily.  Dispense: 90 tablet; Refill: 1  4. GERD without esophagitis  - omeprazole (PRILOSEC) 20 MG capsule; Take 1 capsule (20 mg total) by mouth daily.  Dispense: 90 capsule; Refill: 1  5. Benign essential  tremor  - propranolol ER (INDERAL LA) 60 MG 24 hr capsule; Take 1 capsule (60 mg total) by mouth daily.  Dispense: 90 capsule; Refill: 1  6. Lower urinary tract symptoms (LUTS)  - tamsulosin (FLOMAX) 0.4 MG CAPS capsule; Take 1 capsule (0.4 mg total) by mouth daily.  Dispense: 90 capsule; Refill: 1  7. Vitamin D deficiency  Continue vitamin D supplementation   8. Dyslipidemia (high LDL; low HDL)  - rosuvastatin (CRESTOR) 40 MG tablet; Take 1 tablet (40 mg total) by mouth daily.  Dispense: 90 tablet; Refill: 1

## 2024-02-17 ENCOUNTER — Ambulatory Visit

## 2024-02-17 VITALS — BP 128/74

## 2024-02-17 DIAGNOSIS — N183 Chronic kidney disease, stage 3 unspecified: Secondary | ICD-10-CM

## 2024-02-17 NOTE — Progress Notes (Signed)
 Patient is in office today for a nurse visit for Blood Pressure Check. Patient blood pressure was 128/74, Patient No chest pain, No shortness of breath, No dyspnea on exertion, No orthopnea, No paroxysmal nocturnal dyspnea, No edema, No palpitations, No syncope, No dizziness. Pt states at home bp reading are average 130s/70s with no more dizziness.  Per PCP previous note: HTN: He takes ARB, HCTZ , low dose norvasc and Propanolol ER 60 mg (added by neurologist for essential tremors) . BP today today is well controlled but he gets dizzy when he stands up quickly or stoops over. He states at home around 125-130/70-80's  Denies chest pain, palpitation . Due to his age we will try going down on dose of valsartan hydrochlorothiazide 320/12.5 mg to 160/12.5 mg.

## 2024-02-24 ENCOUNTER — Other Ambulatory Visit: Payer: Self-pay | Admitting: Family Medicine

## 2024-02-24 DIAGNOSIS — N183 Hypertensive chronic kidney disease with stage 1 through stage 4 chronic kidney disease, or unspecified chronic kidney disease: Secondary | ICD-10-CM

## 2024-02-24 DIAGNOSIS — N1831 Chronic kidney disease, stage 3a: Secondary | ICD-10-CM

## 2024-02-25 ENCOUNTER — Telehealth: Payer: Self-pay

## 2024-02-25 ENCOUNTER — Other Ambulatory Visit: Payer: Self-pay | Admitting: Family Medicine

## 2024-02-25 DIAGNOSIS — N1831 Chronic kidney disease, stage 3a: Secondary | ICD-10-CM

## 2024-02-25 DIAGNOSIS — I129 Hypertensive chronic kidney disease with stage 1 through stage 4 chronic kidney disease, or unspecified chronic kidney disease: Secondary | ICD-10-CM

## 2024-02-25 MED ORDER — VALSARTAN-HYDROCHLOROTHIAZIDE 160-12.5 MG PO TABS: 1.0000 | ORAL_TABLET | Freq: Every day | ORAL | 0 refills | Status: DC

## 2024-02-25 NOTE — Telephone Encounter (Signed)
 Copied from CRM [id number]. Topic: Clinical - Prescription Issue >> Feb 24, 2024  4:39 PM Fuller Mandril wrote: Reason for CRM: Patient wife called states patient on has two doses left of valsartan-hydrochlorothiazide (DIOVAN-HCT) 160-12.5 MG tablet. States pharmacy requested and it was denied. Per notes - denied for change not appropriate. They would like to know why and see what he should be doing based on my recent bp check visit. Thank You

## 2024-02-25 NOTE — Telephone Encounter (Signed)
 Copied from CRM [id number]. Topic: General - Other >> Feb 25, 2024 11:07 AM Abundio Miu S wrote: Reason for [id number] spouse returning missed phone call. Patient requesting a callback

## 2024-02-25 NOTE — Telephone Encounter (Signed)
 2 separate encounters have ben made for the same situation. Patient spouse not on DPR. Pt needs to call back to inform him about his medication on BP.

## 2024-02-25 NOTE — Telephone Encounter (Signed)
No answer from pt left detailed vm. °

## 2024-02-25 NOTE — Telephone Encounter (Signed)
 I do not see where you responded back on the nurse visit he just had. Please advice.

## 2024-03-07 ENCOUNTER — Other Ambulatory Visit: Payer: Self-pay | Admitting: Family Medicine

## 2024-03-07 DIAGNOSIS — N1831 Chronic kidney disease, stage 3a: Secondary | ICD-10-CM

## 2024-03-07 DIAGNOSIS — I129 Hypertensive chronic kidney disease with stage 1 through stage 4 chronic kidney disease, or unspecified chronic kidney disease: Secondary | ICD-10-CM

## 2024-03-07 DIAGNOSIS — I10 Essential (primary) hypertension: Secondary | ICD-10-CM

## 2024-03-09 ENCOUNTER — Ambulatory Visit (INDEPENDENT_AMBULATORY_CARE_PROVIDER_SITE_OTHER): Payer: Medicare PPO | Admitting: Vascular Surgery

## 2024-03-09 ENCOUNTER — Other Ambulatory Visit (INDEPENDENT_AMBULATORY_CARE_PROVIDER_SITE_OTHER): Payer: Medicare PPO

## 2024-04-13 ENCOUNTER — Encounter (INDEPENDENT_AMBULATORY_CARE_PROVIDER_SITE_OTHER): Payer: Self-pay

## 2024-05-21 ENCOUNTER — Other Ambulatory Visit: Payer: Self-pay | Admitting: Family Medicine

## 2024-05-21 DIAGNOSIS — N183 Chronic kidney disease, stage 3 unspecified: Secondary | ICD-10-CM

## 2024-05-21 DIAGNOSIS — N1831 Chronic kidney disease, stage 3a: Secondary | ICD-10-CM

## 2024-07-06 DIAGNOSIS — H1045 Other chronic allergic conjunctivitis: Secondary | ICD-10-CM | POA: Diagnosis not present

## 2024-07-06 DIAGNOSIS — H2513 Age-related nuclear cataract, bilateral: Secondary | ICD-10-CM | POA: Diagnosis not present

## 2024-08-03 ENCOUNTER — Encounter: Payer: Self-pay | Admitting: Family Medicine

## 2024-08-03 ENCOUNTER — Ambulatory Visit: Admitting: Family Medicine

## 2024-08-03 VITALS — BP 130/78 | HR 85 | Resp 16 | Ht 73.0 in | Wt 209.0 lb

## 2024-08-03 DIAGNOSIS — N183 Hypertensive chronic kidney disease with stage 1 through stage 4 chronic kidney disease, or unspecified chronic kidney disease: Secondary | ICD-10-CM

## 2024-08-03 DIAGNOSIS — K219 Gastro-esophageal reflux disease without esophagitis: Secondary | ICD-10-CM

## 2024-08-03 DIAGNOSIS — I10 Essential (primary) hypertension: Secondary | ICD-10-CM | POA: Diagnosis not present

## 2024-08-03 DIAGNOSIS — R739 Hyperglycemia, unspecified: Secondary | ICD-10-CM

## 2024-08-03 DIAGNOSIS — G250 Essential tremor: Secondary | ICD-10-CM

## 2024-08-03 DIAGNOSIS — N1831 Chronic kidney disease, stage 3a: Secondary | ICD-10-CM

## 2024-08-03 DIAGNOSIS — E785 Hyperlipidemia, unspecified: Secondary | ICD-10-CM | POA: Diagnosis not present

## 2024-08-03 DIAGNOSIS — J310 Chronic rhinitis: Secondary | ICD-10-CM

## 2024-08-03 DIAGNOSIS — R399 Unspecified symptoms and signs involving the genitourinary system: Secondary | ICD-10-CM

## 2024-08-03 DIAGNOSIS — I129 Hypertensive chronic kidney disease with stage 1 through stage 4 chronic kidney disease, or unspecified chronic kidney disease: Secondary | ICD-10-CM

## 2024-08-03 DIAGNOSIS — I77811 Abdominal aortic ectasia: Secondary | ICD-10-CM

## 2024-08-03 MED ORDER — AMLODIPINE BESYLATE 5 MG PO TABS: 5.0000 mg | ORAL_TABLET | Freq: Every day | ORAL | 1 refills | Status: AC

## 2024-08-03 MED ORDER — PROPRANOLOL HCL ER 60 MG PO CP24: 60.0000 mg | ORAL_CAPSULE | Freq: Every day | ORAL | 1 refills | Status: AC

## 2024-08-03 MED ORDER — TAMSULOSIN HCL 0.4 MG PO CAPS: 0.4000 mg | ORAL_CAPSULE | Freq: Every day | ORAL | 1 refills | Status: AC

## 2024-08-03 MED ORDER — VALSARTAN-HYDROCHLOROTHIAZIDE 160-12.5 MG PO TABS: 1.0000 | ORAL_TABLET | Freq: Every day | ORAL | 0 refills | Status: AC

## 2024-08-03 MED ORDER — ROSUVASTATIN CALCIUM 40 MG PO TABS: 40.0000 mg | ORAL_TABLET | Freq: Every day | ORAL | 1 refills | Status: AC

## 2024-08-03 NOTE — Progress Notes (Signed)
 Name: Zachary Meyer   MRN: [id number]    DOB: 1940-12-30   Date:08/03/2024       Progress Note  Subjective  Chief Complaint  Chief Complaint  Patient presents with   Medical Management of Chronic Issues   Discussed the use of AI scribe software for clinical note transcription with the patient, who gave verbal consent to proceed.  History of Present Illness Zachary Meyer is an 83 year old male who presents for a routine follow-up visit.  His gastroesophageal reflux disease (GERD) is well-controlled with daily omeprazole  taken before breakfast. He has not experienced heartburn or indigestion while on this medication and has a sufficient supply at home.  He is currently taking tamsulosin  for benign prostatic hyperplasia and experiences nocturia, getting up a couple of times at night to urinate. His urinary frequency during the day is dependent on his fluid intake, particularly caffeine, which he avoids at night.  He takes amlodipine  5 mg, valsartan  HCTZ 160/12.5 mg, and propranolol  60 mg for blood pressure control. He takes these medications as directed, with his wife managing the schedule. No dizziness, chest pain, or palpitations.  For hyperlipidemia, he is on rosuvastatin  40 mg and reports no muscle aches or other side effects. His last cholesterol check showed his LDL was below 80.  He uses fluticasone  nasal spray for chronic nasal congestion, primarily at night, and has an adequate supply. He also takes vitamin D  2000 units and baby aspirin  81 mg daily without any side effects.  He has chronic kidney disease and his most recent GFR was 40.  His A1c has been between 5.8 and 6.2 for over five years, and he does not report symptoms of diabetes such as increased hunger, thirst, or urination.  He recently received his second shingles vaccine.    Patient Active Problem List   Diagnosis Date Noted   Polyp of colon 10/30/2023   Ectatic abdominal aorta (HCC) 08/01/2022   History of  colonic polyps    Benign neoplasm of ascending colon    Benign neoplasm of transverse colon    Polyp of sigmoid colon    Senile purpura (HCC) 07/21/2018   Chronic kidney disease, stage III (moderate) (HCC) 07/21/2018   Vitamin D  deficiency 07/21/2018   Hyperglycemia 01/23/2016   Hypertension 07/25/2015   GERD (gastroesophageal reflux disease) 07/25/2015   Hyperlipidemia 07/25/2015   Perennial allergic rhinitis 07/25/2015   Benign essential tremor 07/25/2015    Past Surgical History:  Procedure Laterality Date   COLONOSCOPY WITH PROPOFOL      COLONOSCOPY WITH PROPOFOL  N/A 10/13/2018   Procedure: COLONOSCOPY WITH PROPOFOL ;  Surgeon: Jinny Carmine, MD;  Location: [facility] ENDOSCOPY;  Service: Endoscopy;  Laterality: N/A;   COLONOSCOPY WITH PROPOFOL  N/A 10/30/2023   Procedure: COLONOSCOPY WITH PROPOFOL ;  Surgeon: Jinny Carmine, MD;  Location: [facility] ENDOSCOPY;  Service: Endoscopy;  Laterality: N/A;   MELANOMA EXCISION Left 12/2012   POLYPECTOMY  10/30/2023   Procedure: POLYPECTOMY;  Surgeon: Jinny Carmine, MD;  Location: [facility] ENDOSCOPY;  Service: Endoscopy;;    Family History  Problem Relation Age of Onset   Hypertension Sister    Hypertension Brother    Cancer Brother    Heart attack Mother     Social History   Tobacco Use   Smoking status: Former    Current packs/day: 0.00    Average packs/day: 0.5 packs/day for 10.2 years (5.1 ttl pk-yrs)    Types: Cigarettes    Start date: 11/25/1953    Quit date: 01/22/1964  Years since quitting: 60.5   Smokeless tobacco: Never  Substance Use Topics   Alcohol use: No    Alcohol/week: 0.0 standard drinks of alcohol     Current Outpatient Medications:    aspirin  EC 81 MG tablet, Take 1 tablet (81 mg total) by mouth daily., Disp: 30 tablet, Rfl: 0   Cholecalciferol (VITAMIN D3) 50 MCG (2000 UT) TABS, Take 1 tablet by mouth daily., Disp: 30 tablet, Rfl: 0   fluticasone  (FLONASE ) 50 MCG/ACT nasal spray, SPRAY 2 SPRAYS INTO EACH NOSTRIL EVERY DAY,  Disp: 48 mL, Rfl: 1   omeprazole  (PRILOSEC ) 20 MG capsule, Take 1 capsule (20 mg total) by mouth daily., Disp: 90 capsule, Rfl: 1   amLODipine  (NORVASC ) 5 MG tablet, Take 1 tablet (5 mg total) by mouth daily., Disp: 90 tablet, Rfl: 1   propranolol  ER (INDERAL  LA) 60 MG 24 hr capsule, Take 1 capsule (60 mg total) by mouth daily., Disp: 90 capsule, Rfl: 1   rosuvastatin  (CRESTOR ) 40 MG tablet, Take 1 tablet (40 mg total) by mouth daily., Disp: 90 tablet, Rfl: 1   tamsulosin  (FLOMAX ) 0.4 MG CAPS capsule, Take 1 capsule (0.4 mg total) by mouth daily., Disp: 90 capsule, Rfl: 1   valsartan -hydrochlorothiazide  (DIOVAN -HCT) 160-12.5 MG tablet, Take 1 tablet by mouth daily. In place of 320/12.5 mg, Disp: 90 tablet, Rfl: 0  No Known Allergies  I personally reviewed active problem list, medication list, allergies with the patient/caregiver today.   ROS  Ten systems reviewed and is negative except as mentioned in HPI    Objective Physical Exam  CONSTITUTIONAL: Patient appears well-developed and well-nourished.  No distress. HEENT: Head atraumatic, normocephalic, neck supple. CARDIOVASCULAR: Normal rate, regular rhythm and normal heart sounds.  No murmur heard. No BLE edema. PULMONARY: Effort normal and breath sounds normal. No respiratory distress. ABDOMINAL: There is no tenderness or distention. MUSCULOSKELETAL: Normal gait. Without gross motor or sensory deficit. PSYCHIATRIC: Patient has a normal mood and affect. behavior is normal. Judgment and thought content normal.  Vitals:   08/03/24 0925  BP: 130/78  Pulse: 85  Resp: 16  SpO2: 98%  Weight: 209 lb (94.8 kg)  Height: 6' 1 (1.854 m)    Body mass index is 27.57 kg/m.    PHQ2/9:    08/03/2024    9:23 AM 02/02/2024    9:56 AM 08/04/2023    9:54 AM 01/30/2023    8:51 AM 10/29/2022    8:44 AM  Depression screen PHQ 2/9  Decreased Interest 0 0 0 0 0  Down, Depressed, Hopeless 0 0 0 0 0  PHQ - 2 Score 0 0 0 0 0  Altered sleeping  0  0 0 0  Tired, decreased energy  0 0 0 0  Change in appetite  0 0 0 0  Feeling bad or failure about yourself   0 0 0 0  Trouble concentrating  0 0 0 0  Moving slowly or fidgety/restless  0 0 0 0  Suicidal thoughts  0 0 0 0  PHQ-9 Score  0 0 0 0  Difficult doing work/chores  Not difficult at all       phq 9 is negative  Fall Risk:    08/03/2024    9:23 AM 02/02/2024    9:56 AM 08/04/2023    9:54 AM 01/30/2023    8:50 AM 10/29/2022    8:42 AM  Fall Risk   Falls in the past year? 0 0 0 0 0  Number falls  in past yr: 0 0 0 0   Injury with Fall? 0 0 0 0   Risk for fall due to : No Fall Risks No Fall Risks No Fall Risks No Fall Risks No Fall Risks  Follow up Falls evaluation completed Falls prevention discussed;Education provided;Falls evaluation completed Falls prevention discussed Falls prevention discussed Falls prevention discussed;Education provided      Data saved with a previous flowsheet row definition      Assessment & Plan Gastroesophageal reflux disease GERD well-controlled with omeprazole . No symptoms reported. - Continue omeprazole  as prescribed.  Benign prostatic hyperplasia with nocturia BPH with nocturia persists. No daytime urinary issues. Nighttime caffeine may worsen symptoms. - Refill tamsulosin  prescription. - Advise avoiding caffeine at night.  Essential hypertension and hypertensive chronic kidney disease stage 3a Blood pressure controlled at 130/78 mmHg. CKD stage 3a stable with GFR 40. Valsartan  for kidney protection. - Refill amlodipine , valsartan  HCTZ, and propranolol  prescriptions. - Order blood work to monitor kidney function, anemia, and A1c. - Order urine microalbumin to check for proteinuria.  Hyperlipidemia Hyperlipidemia managed with rosuvastatin . LDL controlled at 77 mg/dL. - Continue rosuvastatin  as prescribed.  Prediabetes Prediabetes stable with A1c 5.8-6.2 for over five years. - Order A1c test.  Chronic rhinitis Chronic rhinitis managed  with fluticasone . Adequate supply available.  Abdominal aortic ectasia Last screened in November 2019. Due for repeat screening. - Order abdominal aortic duplex ultrasound for AAA screening.

## 2024-08-04 ENCOUNTER — Ambulatory Visit: Payer: Self-pay | Admitting: Family Medicine

## 2024-08-04 LAB — HEMOGLOBIN A1C
Hgb A1c MFr Bld: 6 % — ABNORMAL HIGH (ref <5.7)
Mean Plasma Glucose: 126 mg/dL
eAG (mmol/L): 7 mmol/L

## 2024-08-04 LAB — PARATHYROID HORMONE, INTACT (NO CA): PTH: 30 pg/mL (ref 16–77)

## 2024-08-04 LAB — CBC WITH DIFFERENTIAL/PLATELET
Absolute Lymphocytes: 960 {cells}/uL (ref 850–3900)
Absolute Monocytes: 580 {cells}/uL (ref 200–950)
Basophils Absolute: 30 {cells}/uL (ref 0–200)
Basophils Relative: 0.6 %
Eosinophils Absolute: 320 {cells}/uL (ref 15–500)
Eosinophils Relative: 6.4 %
HCT: 41.6 % (ref 38.5–50.0)
Hemoglobin: 13.9 g/dL (ref 13.2–17.1)
MCH: 31.4 pg (ref 27.0–33.0)
MCHC: 33.4 g/dL (ref 32.0–36.0)
MCV: 94.1 fL (ref 80.0–100.0)
MPV: 10.1 fL (ref 7.5–12.5)
Monocytes Relative: 11.6 %
Neutro Abs: 3110 {cells}/uL (ref 1500–7800)
Neutrophils Relative %: 62.2 %
Platelets: 199 Thousand/uL (ref 140–400)
RBC: 4.42 Million/uL (ref 4.20–5.80)
RDW: 11.8 % (ref 11.0–15.0)
Total Lymphocyte: 19.2 %
WBC: 5 Thousand/uL (ref 3.8–10.8)

## 2024-08-04 LAB — COMPREHENSIVE METABOLIC PANEL WITH GFR
AG Ratio: 1.7 (calc) (ref 1.0–2.5)
ALT: 17 U/L (ref 9–46)
AST: 16 U/L (ref 10–35)
Albumin: 4.4 g/dL (ref 3.6–5.1)
Alkaline phosphatase (APISO): 75 U/L (ref 35–144)
BUN/Creatinine Ratio: 13 (calc) (ref 6–22)
BUN: 20 mg/dL (ref 7–25)
CO2: 30 mmol/L (ref 20–32)
Calcium: 9.8 mg/dL (ref 8.6–10.3)
Chloride: 103 mmol/L (ref 98–110)
Creat: 1.55 mg/dL — ABNORMAL HIGH (ref 0.70–1.22)
Globulin: 2.6 g/dL (ref 1.9–3.7)
Glucose, Bld: 105 mg/dL — ABNORMAL HIGH (ref 65–99)
Potassium: 4.7 mmol/L (ref 3.5–5.3)
Sodium: 140 mmol/L (ref 135–146)
Total Bilirubin: 0.5 mg/dL (ref 0.2–1.2)
Total Protein: 7 g/dL (ref 6.1–8.1)
eGFR: 45 mL/min/1.73m2 — ABNORMAL LOW (ref >=60)

## 2024-08-04 LAB — LIPID PANEL
Cholesterol: 119 mg/dL (ref <200)
HDL: 34 mg/dL — ABNORMAL LOW (ref >=40)
LDL Cholesterol (Calc): 65 mg/dL
Non-HDL Cholesterol (Calc): 85 mg/dL (ref <130)
Total CHOL/HDL Ratio: 3.5 (calc) (ref <5.0)
Triglycerides: 117 mg/dL (ref <150)

## 2024-08-04 LAB — MICROALBUMIN / CREATININE URINE RATIO
Creatinine, Urine: 66 mg/dL (ref 20–320)
Microalb Creat Ratio: 12 mg/g{creat} (ref <30)
Microalb, Ur: 0.8 mg/dL

## 2024-08-04 LAB — VITAMIN D 25 HYDROXY (VIT D DEFICIENCY, FRACTURES): Vit D, 25-Hydroxy: 53 ng/mL (ref 30–100)

## 2024-08-28 ENCOUNTER — Other Ambulatory Visit: Payer: Self-pay | Admitting: Family Medicine

## 2024-08-28 DIAGNOSIS — K219 Gastro-esophageal reflux disease without esophagitis: Secondary | ICD-10-CM

## 2024-08-31 ENCOUNTER — Ambulatory Visit (INDEPENDENT_AMBULATORY_CARE_PROVIDER_SITE_OTHER)

## 2024-08-31 DIAGNOSIS — Z23 Encounter for immunization: Secondary | ICD-10-CM | POA: Diagnosis not present

## 2024-09-21 DIAGNOSIS — D2271 Melanocytic nevi of right lower limb, including hip: Secondary | ICD-10-CM | POA: Diagnosis not present

## 2024-09-21 DIAGNOSIS — Z09 Encounter for follow-up examination after completed treatment for conditions other than malignant neoplasm: Secondary | ICD-10-CM | POA: Diagnosis not present

## 2024-09-21 DIAGNOSIS — L821 Other seborrheic keratosis: Secondary | ICD-10-CM | POA: Diagnosis not present

## 2024-09-21 DIAGNOSIS — D0461 Carcinoma in situ of skin of right upper limb, including shoulder: Secondary | ICD-10-CM | POA: Diagnosis not present

## 2024-09-21 DIAGNOSIS — D2261 Melanocytic nevi of right upper limb, including shoulder: Secondary | ICD-10-CM | POA: Diagnosis not present

## 2024-09-21 DIAGNOSIS — D2262 Melanocytic nevi of left upper limb, including shoulder: Secondary | ICD-10-CM | POA: Diagnosis not present

## 2024-09-21 DIAGNOSIS — D225 Melanocytic nevi of trunk: Secondary | ICD-10-CM | POA: Diagnosis not present

## 2024-09-21 DIAGNOSIS — Z85828 Personal history of other malignant neoplasm of skin: Secondary | ICD-10-CM | POA: Diagnosis not present

## 2024-09-21 DIAGNOSIS — Z08 Encounter for follow-up examination after completed treatment for malignant neoplasm: Secondary | ICD-10-CM | POA: Diagnosis not present

## 2024-09-21 DIAGNOSIS — C44612 Basal cell carcinoma of skin of right upper limb, including shoulder: Secondary | ICD-10-CM | POA: Diagnosis not present

## 2024-09-21 DIAGNOSIS — D2272 Melanocytic nevi of left lower limb, including hip: Secondary | ICD-10-CM | POA: Diagnosis not present

## 2024-11-10 ENCOUNTER — Other Ambulatory Visit: Payer: Self-pay | Admitting: Family Medicine

## 2024-11-10 DIAGNOSIS — N183 Chronic kidney disease, stage 3 unspecified: Secondary | ICD-10-CM

## 2024-11-10 DIAGNOSIS — N1831 Chronic kidney disease, stage 3a: Secondary | ICD-10-CM

## 2024-11-12 NOTE — Telephone Encounter (Signed)
 Requested Prescriptions  Pending Prescriptions Disp Refills   valsartan -hydrochlorothiazide  (DIOVAN -HCT) 160-12.5 MG tablet [Pharmacy Med Name: VALSARTAN -HCTZ 160-12.5 MG TAB] 90 tablet 0    Sig: TAKE 1 TABLET BY MOUTH DAILY. IN PLACE OF 320/12.5 MG     Cardiovascular: ARB + Diuretic Combos Failed - 11/12/2024  1:35 PM      Failed - Cr in normal range and within 180 days    Creat  Date Value Ref Range Status  08/03/2024 1.55 (H) 0.70 - 1.22 mg/dL Final   Creatinine, Urine  Date Value Ref Range Status  08/03/2024 66 20 - 320 mg/dL Final         Passed - K in normal range and within 180 days    Potassium  Date Value Ref Range Status  08/03/2024 4.7 3.5 - 5.3 mmol/L Final         Passed - Na in normal range and within 180 days    Sodium  Date Value Ref Range Status  08/03/2024 140 135 - 146 mmol/L Final  04/23/2016 141 134 - 144 mmol/L Final         Passed - eGFR is 10 or above and within 180 days    GFR, Est African American  Date Value Ref Range Status  01/26/2021 54 (L) > OR = 60 mL/min/1.36m2 Final   GFR, Est Non African American  Date Value Ref Range Status  01/26/2021 47 (L) > OR = 60 mL/min/1.71m2 Final   eGFR  Date Value Ref Range Status  08/03/2024 45 (L) > OR = 60 mL/min/1.6m2 Final         Passed - Patient is not pregnant      Passed - Last BP in normal range    BP Readings from Last 1 Encounters:  08/03/24 130/78         Passed - Valid encounter within last 6 months    Recent Outpatient Visits           3 months ago Benign hypertension with chronic kidney disease, stage III [facility])   [place] [facility] Glenard Mire, MD   9 months ago Stage 3a chronic kidney disease [facility])   [facility] Glenard Mire, MD       Future Appointments             In 2 months Glenard, Krichna, MD [facility], [place]

## 2024-11-23 ENCOUNTER — Ambulatory Visit (INDEPENDENT_AMBULATORY_CARE_PROVIDER_SITE_OTHER)

## 2024-11-23 ENCOUNTER — Ambulatory Visit

## 2024-11-23 VITALS — Ht 72.0 in | Wt 208.0 lb

## 2024-11-23 DIAGNOSIS — Z0000 Encounter for general adult medical examination without abnormal findings: Secondary | ICD-10-CM | POA: Diagnosis not present

## 2024-11-23 NOTE — Progress Notes (Signed)
 "  Chief Complaint  Patient presents with   Medicare Wellness     Subjective:   Zachary Meyer is a 83 y.o. male who presents for a Medicare Annual Wellness Visit.  Visit info / Clinical Intake: Medicare Wellness Visit Type:: Subsequent Annual Wellness Visit Persons participating in visit and providing information:: patient Medicare Wellness Visit Mode:: Telephone If telephone:: video declined Since this visit was completed virtually, some vitals may be partially provided or unavailable. Missing vitals are due to the limitations of the virtual format.: Documented vitals are patient reported If Telephone or Video please confirm:: I connected with patient using audio/video enable telemedicine. I verified patient identity with two identifiers, discussed telehealth limitations, and patient agreed to proceed. Patient Location:: home Provider Location:: office Interpreter Needed?: No Pre-visit prep was completed: yes AWV questionnaire completed by patient prior to visit?: yes Date:: 11/16/24 Living arrangements:: (Patient-Rptd) lives with spouse/significant other Patient's Overall Health Status Rating: (Patient-Rptd) good Typical amount of pain: (Patient-Rptd) some Does pain affect daily life?: (Patient-Rptd) no Are you currently prescribed opioids?: no  Dietary Habits and Nutritional Risks How many meals a day?: (Patient-Rptd) 3 Eats fruit and vegetables daily?: (Patient-Rptd) yes Most meals are obtained by: (Patient-Rptd) preparing own meals In the last 2 weeks, have you had any of the following?: none  Functional Status Activities of Daily Living (to include ambulation/medication): (Patient-Rptd) Independent Ambulation: (Patient-Rptd) Independent Medication Administration: (Patient-Rptd) Independent Home Management (perform basic housework or laundry): (Patient-Rptd) Independent Manage your own finances?: (Patient-Rptd) yes Primary transportation is: (Patient-Rptd)  driving Concerns about vision?: no *vision screening is required for WTM* Concerns about hearing?: no  Fall Screening Falls in the past year?: (Patient-Rptd) 0 Number of falls in past year: 0 Was there an injury with Fall?: 0 Fall Risk Category Calculator: 0 Patient Fall Risk Level: Low Fall Risk  Fall Risk Patient at Risk for Falls Due to: Medication side effect Fall risk Follow up: Falls prevention discussed; Falls evaluation completed  Home and Transportation Safety: All rugs have non-skid backing?: (Patient-Rptd) yes All stairs or steps have railings?: (Patient-Rptd) N/A, no stairs Grab bars in the bathtub or shower?: (!) (Patient-Rptd) no Have non-skid surface in bathtub or shower?: (Patient-Rptd) yes Good home lighting?: (Patient-Rptd) yes Regular seat belt use?: (Patient-Rptd) yes Hospital stays in the last year:: (Patient-Rptd) no  Cognitive Assessment Difficulty concentrating, remembering, or making decisions? : (Patient-Rptd) no Will 6CIT or Mini Cog be Completed: yes What year is it?: 0 points What month is it?: 0 points Give patient an address phrase to remember (5 components): [address] About what time is it?: 0 points Count backwards from 20 to 1: 0 points Say the months of the year in reverse: 0 points Repeat the address phrase from earlier: 0 points 6 CIT Score: 0 points  Advance Directives (For Healthcare) Does Patient Have a Medical Advance Directive?: Yes Does patient want to make changes to medical advance directive?: No - Patient declined Type of Advance Directive: Healthcare Power of [place]; Living will Copy of Healthcare Power of Attorney in Chart?: Yes - validated most recent copy scanned in chart (See row information) Copy of Living Will in Chart?: Yes - validated most recent copy scanned in chart (See row information)  Reviewed/Updated  Reviewed/Updated: Reviewed All (Medical, Surgical, Family, Medications, Allergies, Care Teams,  Patient Goals)    Allergies (verified) Patient has no known allergies.   Current Medications (verified) Outpatient Encounter Medications as of 11/23/2024  Medication Sig   amLODipine  (  NORVASC ) 5 MG tablet Take 1 tablet (5 mg total) by mouth daily.   aspirin  EC 81 MG tablet Take 1 tablet (81 mg total) by mouth daily.   Cholecalciferol (VITAMIN D3) 50 MCG (2000 UT) TABS Take 1 tablet by mouth daily.   fluticasone  (FLONASE ) 50 MCG/ACT nasal spray SPRAY 2 SPRAYS INTO EACH NOSTRIL EVERY DAY   omeprazole  (PRILOSEC ) 20 MG capsule TAKE 1 CAPSULE BY MOUTH EVERY DAY   propranolol  ER (INDERAL  LA) 60 MG 24 hr capsule Take 1 capsule (60 mg total) by mouth daily.   rosuvastatin  (CRESTOR ) 40 MG tablet Take 1 tablet (40 mg total) by mouth daily.   tamsulosin  (FLOMAX ) 0.4 MG CAPS capsule Take 1 capsule (0.4 mg total) by mouth daily.   valsartan -hydrochlorothiazide  (DIOVAN -HCT) 160-12.5 MG tablet TAKE 1 TABLET BY MOUTH DAILY. IN PLACE OF 320/12.5 MG   No facility-administered encounter medications on file as of 11/23/2024.    History: Past Medical History:  Diagnosis Date   Cancer (HCC)    Skin Cancer (Basal Cell)   Chronic kidney disease, stage III (moderate) (HCC)    GERD (gastroesophageal reflux disease)    Hyperlipidemia    Hypertension    Past Surgical History:  Procedure Laterality Date   COLONOSCOPY WITH PROPOFOL      COLONOSCOPY WITH PROPOFOL  N/A 10/13/2018   Procedure: COLONOSCOPY WITH PROPOFOL ;  Surgeon: Jinny Carmine, MD;  Location: [facility] ENDOSCOPY;  Service: Endoscopy;  Laterality: N/A;   COLONOSCOPY WITH PROPOFOL  N/A 10/30/2023   Procedure: COLONOSCOPY WITH PROPOFOL ;  Surgeon: Jinny Carmine, MD;  Location: [facility] ENDOSCOPY;  Service: Endoscopy;  Laterality: N/A;   MELANOMA EXCISION Left 12/2012   POLYPECTOMY  10/30/2023   Procedure: POLYPECTOMY;  Surgeon: Jinny Carmine, MD;  Location: [facility] ENDOSCOPY;  Service: Endoscopy;;   Family History  Problem Relation Age of Onset   Hypertension  Sister    Hypertension Brother    Cancer Brother    Heart attack Mother    Social History   Occupational History   Occupation: driver     Comment: road crew for [employer]   Tobacco Use   Smoking status: Former    Current packs/day: 0.00    Average packs/day: 0.5 packs/day for 10.2 years (5.1 ttl pk-yrs)    Types: Cigarettes    Start date: 11/25/1953    Quit date: 01/22/1964    Years since quitting: 60.8   Smokeless tobacco: Never  Vaping Use   Vaping status: Never Used  Substance and Sexual Activity   Alcohol use: No    Alcohol/week: 0.0 standard drinks of alcohol   Drug use: No   Sexual activity: Not Currently    Birth control/protection: None   Tobacco Counseling Counseling given: Not Answered  SDOH Screenings   Food Insecurity: No Food Insecurity (11/16/2024)  Housing: Low Risk (11/16/2024)  Transportation Needs: No Transportation Needs (11/16/2024)  Utilities: Not At Risk (11/23/2024)  Alcohol Screen: Low Risk (11/16/2024)  Depression (PHQ2-9): Low Risk (11/23/2024)  Financial Resource Strain: Low Risk (11/16/2024)  Physical Activity: Unknown (11/16/2024)  Social Connections: Moderately Integrated (11/16/2024)  Stress: No Stress Concern Present (11/16/2024)  Tobacco Use: Medium Risk (11/23/2024)  Health Literacy: Adequate Health Literacy (11/23/2024)   See flowsheets for full screening details  Depression Screen PHQ 2 & 9 Depression Scale- Over the past 2 weeks, how often have you been bothered by any of the following problems? Little interest or pleasure in doing things: 0 Feeling down, depressed, or hopeless (PHQ Adolescent also includes...irritable): 0 PHQ-2 Total  Score: 0 Trouble falling or staying asleep, or sleeping too much: 0 Feeling tired or having little energy: 0 Poor appetite or overeating (PHQ Adolescent also includes...weight loss): 0 Feeling bad about yourself - or that you are a failure or have let yourself or your family  down: 0 Trouble concentrating on things, such as reading the newspaper or watching television (PHQ Adolescent also includes...like school work): 0 Moving or speaking so slowly that other people could have noticed. Or the opposite - being so fidgety or restless that you have been moving around a lot more than usual: 0 Thoughts that you would be better off dead, or of hurting yourself in some way: 0 PHQ-9 Total Score: 0 If you checked off any problems, how difficult have these problems made it for you to do your work, take care of things at home, or get along with other people?: Not difficult at all  Depression Treatment Depression Interventions/Treatment : EYV7-0 Score <4 Follow-up Not Indicated     Goals Addressed             This Visit's Progress    Patient Stated       11/23/2024, stay healthy             Objective:    Today's Vitals   11/23/24 1022  Weight: 208 lb (94.3 kg)  Height: 6' (1.829 m)   Body mass index is 28.21 kg/m.  Hearing/Vision screen Hearing Screening - Comments:: Denies hearing issues Vision Screening - Comments:: Regular eye exams Immunizations and Health Maintenance Health Maintenance  Topic Date Due   COVID-19 Vaccine (9 - 2025-26 season) 03/28/2025   Medicare Annual Wellness (AWV)  11/23/2025   Colonoscopy  10/29/2028   DTaP/Tdap/Td (2 - Td or Tdap) 08/11/2034   Pneumococcal Vaccine: 50+ Years  Completed   Influenza Vaccine  Completed   Zoster Vaccines- Shingrix   Completed   Meningococcal B Vaccine  Aged Out   Hepatitis C Screening  Discontinued        Assessment/Plan:  This is a routine wellness examination for Zachary Meyer.  Patient Care Team: Sowles, Krichna, MD as PCP - General (Family Medicine) Dasher, Alm LABOR, MD as Consulting Physician (Dermatology) Mevelyn JONETTA Bathe, OD [facility])  I have personally reviewed and noted the following in the patients chart:   Medical and social history Use of alcohol, tobacco or illicit drugs   Current medications and supplements including opioid prescriptions. Functional ability and status Nutritional status Physical activity Advanced directives List of other physicians Hospitalizations, surgeries, and ER visits in previous 12 months Vitals Screenings to include cognitive, depression, and falls Referrals and appointments  No orders of the defined types were placed in this encounter.  In addition, I have reviewed and discussed with patient certain preventive protocols, quality metrics, and best practice recommendations. A written personalized care plan for preventive services as well as general preventive health recommendations were provided to patient.   Ardella FORBES Dawn, LPN   [id number]   Return in 1 year (on 11/23/2025).  After Visit Summary: (MyChart) Due to this being a telephonic visit, the after visit summary with patients personalized plan was offered to patient via MyChart   Nurse Notes: No voiced or noted concerns at this time  "

## 2024-11-23 NOTE — Patient Instructions (Signed)
 Zachary Meyer,  Thank you for taking the time for your Medicare Wellness Visit. I appreciate your continued commitment to your health goals. Please review the care plan we discussed, and feel free to reach out if I can assist you further.  Please note that Annual Wellness Visits do not include a physical exam. Some assessments may be limited, especially if the visit was conducted virtually. If needed, we may recommend an in-person follow-up with your provider.  Ongoing Care Seeing your primary care provider every 3 to 6 months helps us  monitor your health and provide consistent, personalized care.   Referrals If a referral was made during today's visit and you haven't received any updates within two weeks, please contact the referred provider directly to check on the status.  Recommended Screenings:  Health Maintenance  Topic Date Due   Medicare Annual Wellness Visit  10/30/2023   COVID-19 Vaccine (9 - 2025-26 season) 03/28/2025   Colon Cancer Screening  10/29/2028   DTaP/Tdap/Td vaccine (2 - Td or Tdap) 08/11/2034   Pneumococcal Vaccine for age over 69  Completed   Flu Shot  Completed   Zoster (Shingles) Vaccine  Completed   Meningitis B Vaccine  Aged Out   Hepatitis C Screening  Discontinued       11/16/2024    3:08 PM  Advanced Directives  Does Patient Have a Medical Advance Directive? Yes  Type of Estate Agent of [place];Living will  Does patient want to make changes to medical advance directive? No - Patient declined  Copy of Healthcare Power of Attorney in Chart? Yes - validated most recent copy scanned in chart (See row information)    Vision: Annual vision screenings are recommended for early detection of glaucoma, cataracts, and diabetic retinopathy. These exams can also reveal signs of chronic conditions such as diabetes and high blood pressure.  Dental: Annual dental screenings help detect early signs of oral cancer, gum disease, and other  conditions linked to overall health, including heart disease and diabetes.  Please see the attached documents for additional preventive care recommendations.

## 2025-02-01 ENCOUNTER — Ambulatory Visit: Admitting: Family Medicine

## 2025-02-25 ENCOUNTER — Ambulatory Visit (INDEPENDENT_AMBULATORY_CARE_PROVIDER_SITE_OTHER): Admitting: Nurse Practitioner

## 2025-02-25 ENCOUNTER — Other Ambulatory Visit (INDEPENDENT_AMBULATORY_CARE_PROVIDER_SITE_OTHER)

## 2025-12-01 ENCOUNTER — Ambulatory Visit
